# Patient Record
Sex: Male | Born: 2000 | Hispanic: Yes | Marital: Single | Smoking: Never smoker
Health system: Southern US, Community
[De-identification: ages and names within clinical notes are randomized; demographics above are authoritative.]

## PROBLEM LIST (undated history)

## (undated) DIAGNOSIS — F909 Attention-deficit hyperactivity disorder, unspecified type: Secondary | ICD-10-CM

## (undated) DIAGNOSIS — F419 Anxiety disorder, unspecified: Secondary | ICD-10-CM

## (undated) DIAGNOSIS — F32A Depression, unspecified: Secondary | ICD-10-CM

## (undated) HISTORY — DX: Depression, unspecified: F32.A

---
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
## 2020-01-08 ENCOUNTER — Ambulatory Visit: Payer: BC Managed Care – PPO | Admitting: Internal Medicine

## 2020-01-08 ENCOUNTER — Other Ambulatory Visit: Payer: Self-pay

## 2020-01-08 ENCOUNTER — Encounter: Payer: Self-pay | Admitting: Internal Medicine

## 2020-01-08 VITALS — BP 114/82 | HR 85 | Temp 97.5°F | Resp 16 | Ht 74.0 in | Wt 195.0 lb

## 2020-01-08 DIAGNOSIS — Z889 Allergy status to unspecified drugs, medicaments and biological substances status: Secondary | ICD-10-CM

## 2020-01-08 DIAGNOSIS — F411 Generalized anxiety disorder: Secondary | ICD-10-CM

## 2020-01-08 DIAGNOSIS — Z9189 Other specified personal risk factors, not elsewhere classified: Secondary | ICD-10-CM

## 2020-01-08 DIAGNOSIS — Z0001 Encounter for general adult medical examination with abnormal findings: Secondary | ICD-10-CM

## 2020-01-08 NOTE — Progress Notes (Signed)
[facility] [address] [place], [place] [address]  Internal MEDICINE  Office Visit Note  Patient Name: Sean Valdez  [id number]  [id number]  Date of Service: 01/11/2020   Complaints/HPI Pt is here for establishment of PCP. Chief Complaint  Patient presents with  . New Patient (Initial Visit)  . Anxiety  . ADHD  . policy update form    received   HPI Pt is here for establishment. Has hx of ADD, depression with GAD. He was taking Lexapro 30 mg at one point however has been not taking it daily. He does not take Adderall any more either, goes to the gym and found other ways to overcome these problems. Has friends and family is not involved as much, denies use of any street drugs. Has h/o allergies   Current Medication: Outpatient Encounter Medications as of 01/08/2020  Medication Sig  . EPINEPHrine 0.3 mg/0.3 mL IJ SOAJ injection Inject 0.3 mg into the muscle as needed for allergies.  . [DISCONTINUED] methylphenidate 54 MG PO CR tablet Take 1 tablet by mouth daily. (Patient not taking: Reported on 01/08/2020)  . [DISCONTINUED] sertraline (ZOLOFT) 50 MG tablet Take 1.5 tablets by mouth daily. (Patient not taking: Reported on 01/08/2020)  . [DISCONTINUED] traZODone (DESYREL) 50 MG tablet Take 1 tablet by mouth at bedtime. (Patient not taking: Reported on 01/08/2020)   No facility-administered encounter medications on file as of 01/08/2020.    Surgical History: History reviewed. No pertinent surgical history.  Medical History: Past Medical History:  Diagnosis Date  . ADHD (attention deficit hyperactivity disorder)   . Anxiety   . Depression     Family History: Family History  Problem Relation Age of Onset  . Hypertension Mother   . ADD / ADHD Sister   . ADD / ADHD Sister     Social History   Socioeconomic History  . Marital status: Single    Spouse name: Not on file  . Number of children: Not on file  . Years of education: Not on file  . Highest  education level: Not on file  Occupational History  . Not on file  Tobacco Use  . Smoking status: Never Smoker  . Smokeless tobacco: Never Used  Vaping Use  . Vaping Use: Some days  Substance and Sexual Activity  . Alcohol use: Not Currently  . Drug use: No  . Sexual activity: Not on file  Other Topics Concern  . Not on file  Social History Narrative  . Not on file   Social Determinants of Health   Financial Resource Strain: Not on file  Food Insecurity: Not on file  Transportation Needs: Not on file  Physical Activity: Not on file  Stress: Not on file  Social Connections: Not on file  Intimate Partner Violence: Not on file     Review of Systems  Constitutional: Negative for chills, fatigue and unexpected weight change.  HENT: Positive for postnasal drip. Negative for congestion, rhinorrhea, sneezing and sore throat.   Eyes: Negative for redness.  Respiratory: Negative for cough, chest tightness and shortness of breath.   Cardiovascular: Negative for chest pain and palpitations.  Gastrointestinal: Negative for abdominal pain, constipation, diarrhea, nausea and vomiting.  Genitourinary: Negative for dysuria and frequency.  Musculoskeletal: Negative for arthralgias, back pain, joint swelling and neck pain.  Skin: Negative for rash.  Neurological: Negative.  Negative for tremors and numbness.  Hematological: Negative for adenopathy. Does not bruise/bleed easily.  Psychiatric/Behavioral: Negative for behavioral problems (Depression), sleep disturbance  and suicidal ideas. The patient is not nervous/anxious.     Vital Signs: BP 114/82   Pulse 85   Temp (!) 97.5 F (36.4 C)   Resp 16   Ht 6\' 2"  (1.88 m)   Wt 195 lb (88.5 kg)   SpO2 98%   BMI 25.04 kg/m    Physical Exam Constitutional:      General: He is not in acute distress.    Appearance: He is well-developed and well-nourished. He is not diaphoretic.  HENT:     Head: Normocephalic and atraumatic.      Mouth/Throat:     Mouth: Oropharynx is clear and moist.     Pharynx: No oropharyngeal exudate.  Eyes:     Extraocular Movements: EOM normal.     Pupils: Pupils are equal, round, and reactive to light.  Neck:     Thyroid: No thyromegaly.     Vascular: No JVD.     Trachea: No tracheal deviation.  Cardiovascular:     Rate and Rhythm: Normal rate and regular rhythm.     Heart sounds: Normal heart sounds. No murmur heard. No friction rub. No gallop.   Pulmonary:     Effort: Pulmonary effort is normal. No respiratory distress.     Breath sounds: No wheezing or rales.  Chest:     Chest wall: No tenderness.  Abdominal:     General: Bowel sounds are normal.     Palpations: Abdomen is soft.  Musculoskeletal:        General: Normal range of motion.     Cervical back: Normal range of motion and neck supple.  Lymphadenopathy:     Cervical: No cervical adenopathy.  Skin:    General: Skin is warm and dry.  Neurological:     Mental Status: He is alert and oriented to person, place, and time.     Cranial Nerves: No cranial nerve deficit.  Psychiatric:        Mood and Affect: Mood and affect normal.        Behavior: Behavior normal.        Thought Content: Thought content normal.        Judgment: Judgment normal.       Assessment/Plan: 1. Encounter for general adult medical examination with abnormal findings Age appropriate labs are ordered  - CBC with Differential/Platelet - Lipid Panel With LDL/HDL Ratio - TSH - T4, free - Comprehensive metabolic panel - Urinalysis - Prolactin; Future  2. H/O multiple allergies Monitor, continue Claritin OTC, Epipen prn   3. GAD (generalized anxiety disorder) Does not require any therapy, CBT, Headspace type of APP reccommended   General Counseling: Wesly verbalizes understanding of the findings of todays visit and agrees with plan of treatment. I have discussed any further diagnostic evaluation that may be needed or ordered today. We also  reviewed his medications today. he has been encouraged to call the office with any questions or concerns that should arise related to todays visit.   Orders Placed This Encounter  Procedures  . CBC with Differential/Platelet  . Lipid Panel With LDL/HDL Ratio  . TSH  . T4, free  . Comprehensive metabolic panel  . Urinalysis  . Prolactin    No orders of the defined types were placed in this encounter.   Time spent:30 Minutes

## 2020-02-16 ENCOUNTER — Telehealth: Payer: Self-pay

## 2020-02-16 NOTE — Telephone Encounter (Signed)
Spoke with pt this morning, they said they went to [facility] and they did not have an order for blood work. Spoke to [facility] and the order is there and they said it has been done. LMOM to inform pt that everything seems to have been resolved but if there are any concerns to call back.

## 2020-02-17 LAB — T4, FREE: Free T4: 1.5 ng/dL (ref 0.82–1.77)

## 2020-02-17 LAB — CBC WITH DIFFERENTIAL/PLATELET
Basophils Absolute: 0 10*3/uL (ref 0.0–0.2)
Basos: 0 %
EOS (ABSOLUTE): 0.1 10*3/uL (ref 0.0–0.4)
Eos: 1 %
Hematocrit: 48 % (ref 37.5–51.0)
Hemoglobin: 16.7 g/dL (ref 13.0–17.7)
Immature Grans (Abs): 0 10*3/uL (ref 0.0–0.1)
Immature Granulocytes: 0 %
Lymphocytes Absolute: 1.5 10*3/uL (ref 0.7–3.1)
Lymphs: 21 %
MCH: 31.4 pg (ref 26.6–33.0)
MCHC: 34.8 g/dL (ref 31.5–35.7)
MCV: 90 fL (ref 79–97)
Monocytes Absolute: 0.6 10*3/uL (ref 0.1–0.9)
Monocytes: 9 %
Neutrophils Absolute: 4.7 10*3/uL (ref 1.4–7.0)
Neutrophils: 69 %
Platelets: 231 10*3/uL (ref 150–450)
RBC: 5.32 x10E6/uL (ref 4.14–5.80)
RDW: 12.4 % (ref 11.6–15.4)
WBC: 6.9 10*3/uL (ref 3.4–10.8)

## 2020-02-17 LAB — COMPREHENSIVE METABOLIC PANEL
ALT: 22 IU/L (ref 0–44)
AST: 22 IU/L (ref 0–40)
Albumin/Globulin Ratio: 1.8 (ref 1.2–2.2)
Albumin: 4.6 g/dL (ref 4.1–5.2)
Alkaline Phosphatase: 86 IU/L (ref 51–125)
BUN/Creatinine Ratio: 10 (ref 9–20)
BUN: 9 mg/dL (ref 6–20)
Bilirubin Total: 1 mg/dL (ref 0.0–1.2)
CO2: 23 mmol/L (ref 20–29)
Calcium: 10.1 mg/dL (ref 8.7–10.2)
Chloride: 102 mmol/L (ref 96–106)
Creatinine, Ser: 0.91 mg/dL (ref 0.76–1.27)
GFR calc Af Amer: 140 mL/min/{1.73_m2} (ref >59)
GFR calc non Af Amer: 121 mL/min/{1.73_m2} (ref >59)
Globulin, Total: 2.6 g/dL (ref 1.5–4.5)
Glucose: 78 mg/dL (ref 65–99)
Potassium: 4.2 mmol/L (ref 3.5–5.2)
Sodium: 140 mmol/L (ref 134–144)
Total Protein: 7.2 g/dL (ref 6.0–8.5)

## 2020-02-17 LAB — LIPID PANEL WITH LDL/HDL RATIO
Cholesterol, Total: 150 mg/dL (ref 100–199)
HDL: 58 mg/dL (ref >39)
LDL Chol Calc (NIH): 70 mg/dL (ref 0–99)
LDL/HDL Ratio: 1.2 ratio (ref 0.0–3.6)
Triglycerides: 125 mg/dL (ref 0–149)
VLDL Cholesterol Cal: 22 mg/dL (ref 5–40)

## 2020-02-17 LAB — TSH: TSH: 1.39 u[IU]/mL (ref 0.450–4.500)

## 2020-02-17 LAB — URINALYSIS
Bilirubin, UA: NEGATIVE
Glucose, UA: NEGATIVE
Ketones, UA: NEGATIVE
Leukocytes,UA: NEGATIVE
Nitrite, UA: NEGATIVE
Specific Gravity, UA: 1.026 (ref 1.005–1.030)
Urobilinogen, Ur: 0.2 mg/dL (ref 0.2–1.0)
pH, UA: 6.5 (ref 5.0–7.5)

## 2020-06-15 ENCOUNTER — Encounter: Payer: Self-pay | Admitting: Emergency Medicine

## 2020-06-15 ENCOUNTER — Emergency Department
Admission: EM | Admit: 2020-06-15 | Discharge: 2020-06-15 | Disposition: A | Discharge status: Home / Self Care | Payer: Medicaid Other | Attending: Emergency Medicine | Admitting: Emergency Medicine

## 2020-06-15 ENCOUNTER — Other Ambulatory Visit: Payer: Self-pay

## 2020-06-15 DIAGNOSIS — Z20822 Contact with and (suspected) exposure to covid-19: Secondary | ICD-10-CM | POA: Diagnosis not present

## 2020-06-15 DIAGNOSIS — F909 Attention-deficit hyperactivity disorder, unspecified type: Secondary | ICD-10-CM | POA: Diagnosis not present

## 2020-06-15 DIAGNOSIS — Z046 Encounter for general psychiatric examination, requested by authority: Secondary | ICD-10-CM | POA: Diagnosis present

## 2020-06-15 DIAGNOSIS — R456 Violent behavior: Secondary | ICD-10-CM | POA: Insufficient documentation

## 2020-06-15 DIAGNOSIS — F32A Depression, unspecified: Secondary | ICD-10-CM

## 2020-06-15 DIAGNOSIS — F332 Major depressive disorder, recurrent severe without psychotic features: Secondary | ICD-10-CM | POA: Diagnosis not present

## 2020-06-15 DIAGNOSIS — R4689 Other symptoms and signs involving appearance and behavior: Secondary | ICD-10-CM

## 2020-06-15 LAB — COMPREHENSIVE METABOLIC PANEL
ALT: 37 U/L (ref 0–44)
AST: 28 U/L (ref 15–41)
Albumin: 4.8 g/dL (ref 3.5–5.0)
Alkaline Phosphatase: 76 U/L (ref 38–126)
Anion gap: 12 (ref 5–15)
BUN: 11 mg/dL (ref 6–20)
CO2: 22 mmol/L (ref 22–32)
Calcium: 9.2 mg/dL (ref 8.9–10.3)
Chloride: 103 mmol/L (ref 98–111)
Creatinine, Ser: 0.77 mg/dL (ref 0.61–1.24)
GFR, Estimated: >60 mL/min (ref >60)
Glucose, Bld: 104 mg/dL — ABNORMAL HIGH (ref 70–99)
Potassium: 3.4 mmol/L — ABNORMAL LOW (ref 3.5–5.1)
Sodium: 137 mmol/L (ref 135–145)
Total Bilirubin: 0.8 mg/dL (ref 0.3–1.2)
Total Protein: 8.1 g/dL (ref 6.5–8.1)

## 2020-06-15 LAB — CBC
HCT: 43.6 % (ref 39.0–52.0)
Hemoglobin: 15.4 g/dL (ref 13.0–17.0)
MCH: 31.6 pg (ref 26.0–34.0)
MCHC: 35.3 g/dL (ref 30.0–36.0)
MCV: 89.3 fL (ref 80.0–100.0)
Platelets: 250 10*3/uL (ref 150–400)
RBC: 4.88 MIL/uL (ref 4.22–5.81)
RDW: 11.9 % (ref 11.5–15.5)
WBC: 7.9 10*3/uL (ref 4.0–10.5)
nRBC: 0 % (ref 0.0–0.2)

## 2020-06-15 LAB — RESP PANEL BY RT-PCR (FLU A&B, COVID) ARPGX2
Influenza A by PCR: NEGATIVE
Influenza B by PCR: NEGATIVE
SARS Coronavirus 2 by RT PCR: NEGATIVE

## 2020-06-15 LAB — URINE DRUG SCREEN, QUALITATIVE (ARMC ONLY)
Amphetamines, Ur Screen: NOT DETECTED
Barbiturates, Ur Screen: NOT DETECTED
Benzodiazepine, Ur Scrn: NOT DETECTED
Cannabinoid 50 Ng, Ur ~~LOC~~: NOT DETECTED
Cocaine Metabolite,Ur ~~LOC~~: NOT DETECTED
MDMA (Ecstasy)Ur Screen: NOT DETECTED
Methadone Scn, Ur: NOT DETECTED
Opiate, Ur Screen: NOT DETECTED
Phencyclidine (PCP) Ur S: NOT DETECTED
Tricyclic, Ur Screen: NOT DETECTED

## 2020-06-15 LAB — ETHANOL: Alcohol, Ethyl (B): 95 mg/dL — ABNORMAL HIGH (ref <10)

## 2020-06-15 LAB — ACETAMINOPHEN LEVEL: Acetaminophen (Tylenol), Serum: <10 ug/mL — ABNORMAL LOW (ref 10–30)

## 2020-06-15 LAB — SALICYLATE LEVEL: Salicylate Lvl: <7 mg/dL — ABNORMAL LOW (ref 7.0–30.0)

## 2020-06-15 NOTE — ED Notes (Signed)
ED psych provider at bedside.
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
## 2020-06-15 NOTE — ED Notes (Signed)
Pt discharged home with mother.  VS stable. Pt denies SI.  All belongings returned to patient. Discharge instructions reviewed with patient.

## 2020-06-15 NOTE — ED Notes (Signed)
Pt's Mother [phone] Pt's father # [phone]

## 2020-06-15 NOTE — Discharge Instructions (Addendum)
Please continue your medications as prescribed.  Please follow-up with [facility].
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
## 2020-07-08 ENCOUNTER — Ambulatory Visit: Payer: Medicaid Other | Admitting: Nurse Practitioner

## 2020-07-09 ENCOUNTER — Telehealth: Payer: Self-pay

## 2020-07-09 NOTE — Telephone Encounter (Signed)
Lvm to r/s 07-08-20 no show visit-Toni

## 2020-07-10 ENCOUNTER — Ambulatory Visit: Payer: Medicaid Other | Admitting: Nurse Practitioner

## 2020-11-05 ENCOUNTER — Encounter: Payer: Self-pay | Admitting: Emergency Medicine

## 2020-11-05 ENCOUNTER — Other Ambulatory Visit: Payer: Self-pay

## 2020-11-05 ENCOUNTER — Emergency Department
Admission: EM | Admit: 2020-11-05 | Discharge: 2020-11-06 | Disposition: A | Discharge status: Home / Self Care | Payer: BC Managed Care – PPO | Attending: Emergency Medicine | Admitting: Emergency Medicine

## 2020-11-05 DIAGNOSIS — Z20822 Contact with and (suspected) exposure to covid-19: Secondary | ICD-10-CM | POA: Insufficient documentation

## 2020-11-05 DIAGNOSIS — Y907 Blood alcohol level of 200-239 mg/100 ml: Secondary | ICD-10-CM | POA: Insufficient documentation

## 2020-11-05 DIAGNOSIS — F10920 Alcohol use, unspecified with intoxication, uncomplicated: Secondary | ICD-10-CM

## 2020-11-05 DIAGNOSIS — Z046 Encounter for general psychiatric examination, requested by authority: Secondary | ICD-10-CM | POA: Diagnosis not present

## 2020-11-05 DIAGNOSIS — R45851 Suicidal ideations: Secondary | ICD-10-CM | POA: Diagnosis not present

## 2020-11-05 DIAGNOSIS — F332 Major depressive disorder, recurrent severe without psychotic features: Secondary | ICD-10-CM | POA: Diagnosis not present

## 2020-11-05 DIAGNOSIS — F10129 Alcohol abuse with intoxication, unspecified: Secondary | ICD-10-CM | POA: Insufficient documentation

## 2020-11-05 DIAGNOSIS — F10929 Alcohol use, unspecified with intoxication, unspecified: Secondary | ICD-10-CM | POA: Diagnosis present

## 2020-11-05 LAB — COMPREHENSIVE METABOLIC PANEL
ALT: 136 U/L — ABNORMAL HIGH (ref 0–44)
AST: 108 U/L — ABNORMAL HIGH (ref 15–41)
Albumin: 4.5 g/dL (ref 3.5–5.0)
Alkaline Phosphatase: 68 U/L (ref 38–126)
Anion gap: 11 (ref 5–15)
BUN: 8 mg/dL (ref 6–20)
CO2: 26 mmol/L (ref 22–32)
Calcium: 9.2 mg/dL (ref 8.9–10.3)
Chloride: 101 mmol/L (ref 98–111)
Creatinine, Ser: 0.82 mg/dL (ref 0.61–1.24)
GFR, Estimated: >60 mL/min (ref >60)
Glucose, Bld: 118 mg/dL — ABNORMAL HIGH (ref 70–99)
Potassium: 3 mmol/L — ABNORMAL LOW (ref 3.5–5.1)
Sodium: 138 mmol/L (ref 135–145)
Total Bilirubin: 1.2 mg/dL (ref 0.3–1.2)
Total Protein: 7.7 g/dL (ref 6.5–8.1)

## 2020-11-05 LAB — CBC
HCT: 48.4 % (ref 39.0–52.0)
Hemoglobin: 17.3 g/dL — ABNORMAL HIGH (ref 13.0–17.0)
MCH: 31.9 pg (ref 26.0–34.0)
MCHC: 35.7 g/dL (ref 30.0–36.0)
MCV: 89.1 fL (ref 80.0–100.0)
Platelets: 260 10*3/uL (ref 150–400)
RBC: 5.43 MIL/uL (ref 4.22–5.81)
RDW: 11.9 % (ref 11.5–15.5)
WBC: 9.9 10*3/uL (ref 4.0–10.5)
nRBC: 0 % (ref 0.0–0.2)

## 2020-11-05 LAB — URINE DRUG SCREEN, QUALITATIVE (ARMC ONLY)
Amphetamines, Ur Screen: NOT DETECTED
Barbiturates, Ur Screen: NOT DETECTED
Benzodiazepine, Ur Scrn: NOT DETECTED
Cannabinoid 50 Ng, Ur ~~LOC~~: NOT DETECTED
Cocaine Metabolite,Ur ~~LOC~~: NOT DETECTED
MDMA (Ecstasy)Ur Screen: NOT DETECTED
Methadone Scn, Ur: NOT DETECTED
Opiate, Ur Screen: NOT DETECTED
Phencyclidine (PCP) Ur S: NOT DETECTED
Tricyclic, Ur Screen: NOT DETECTED

## 2020-11-05 LAB — ETHANOL: Alcohol, Ethyl (B): 231 mg/dL — ABNORMAL HIGH (ref <10)
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
## 2020-11-05 NOTE — ED Notes (Addendum)
Pt belongings:  Black t-shirt, black sweatshirt, brown sandals, black socks, black shorts, green and white boxers
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
## 2020-11-05 NOTE — ED Provider Notes (Signed)
[facility] Emergency Department Provider Note  ____________________________________________   Event Date/Time   First MD Initiated Contact with Patient 11/05/20 2311     (approximate)  I have reviewed the triage vital signs and the nursing notes.   HISTORY  Chief Complaint Psychiatric Evaluation    HPI Sean Valdez is a 20 y.o. male with history of depression, ADHD, anxiety with previous suicide attempts who presents to the emergency department under IVC.  Patient is intoxicated and reports that he felt "sad" today.  When asked if he is feeling suicidal patient is very vague and states he was feeling suicidal earlier but is not able to tell me if he is suicidal now.  Denies HI, hallucinations.  States his parents were concerned about him tonight and he was brought in by police.    Patient brought to the emergency department by her grandma deputy.  Deputy took out [employer].  Per IVC paperwork "respondent stated to deputies that he wanted to kill himself.  He is upset about his relationship with his girlfriend and is not taking his prescribed medication for depression.  Respondent is also self-medicating with alcohol."    Past Medical History:  Diagnosis Date   ADHD (attention deficit hyperactivity disorder)    Anxiety    Depression     Patient Active Problem List   Diagnosis Date Noted   MDD (major depressive disorder), recurrent episode, severe (HCC) 06/15/2020    History reviewed. No pertinent surgical history.  Prior to Admission medications   Medication Sig Start Date End Date Taking? Authorizing Provider  EPINEPHrine 0.3 mg/0.3 mL IJ SOAJ injection Inject 0.3 mg into the muscle as needed for allergies. 08/30/15   [provider]    Allergies Penicillins and Bupropion  Family History  Problem Relation Age of Onset   Hypertension Mother    ADD / ADHD Sister    ADD / ADHD Sister     Social History Social History   Tobacco  Use   Smoking status: Never   Smokeless tobacco: Never  Vaping Use   Vaping Use: Some days  Substance Use Topics   Alcohol use: Not Currently   Drug use: No    Review of Systems Constitutional: No fever. Eyes: No visual changes. ENT: No sore throat. Cardiovascular: Denies chest pain. Respiratory: Denies shortness of breath. Gastrointestinal: No nausea, vomiting, diarrhea. Genitourinary: Negative for dysuria. Musculoskeletal: Negative for back pain. Skin: Negative for rash. Neurological: Negative for focal weakness or numbness.  ____________________________________________   PHYSICAL EXAM:  VITAL SIGNS: ED Triage Vitals  Enc Vitals Group     BP 11/05/20 2249 (!) 143/93     Pulse Rate 11/05/20 2249 (!) 125     Resp 11/05/20 2249 16     Temp 11/05/20 2249 98.7 F (37.1 C)     Temp Source 11/05/20 2249 Oral     SpO2 11/05/20 2249 98 %     Weight 11/05/20 2255 204 lb (92.5 kg)     Height 11/05/20 2255 6\' 2"  (1.88 m)     Head Circumference --      Peak Flow --      Pain Score 11/05/20 2254 0     Pain Loc --      Pain Edu? --      Excl. in GC? --    CONSTITUTIONAL: Alert and oriented and responds appropriately to questions.  Peers intoxicated. HEAD: Normocephalic EYES: Conjunctivae clear, pupils appear equal, EOM appear intact ENT:  normal nose; moist mucous membranes NECK: Supple, normal ROM CARD: RRR; S1 and S2 appreciated; no murmurs, no clicks, no rubs, no gallops RESP: Normal chest excursion without splinting or tachypnea; breath sounds clear and equal bilaterally; no wheezes, no rhonchi, no rales, no hypoxia or respiratory distress, speaking full sentences ABD/GI: Normal bowel sounds; non-distended; soft, non-tender, no rebound, no guarding, no peritoneal signs, no hepatosplenomegaly BACK: The back appears normal EXT: Normal ROM in all joints; no deformity noted, no edema; no cyanosis SKIN: Normal color for age and race; warm; no rash on exposed skin NEURO:  Moves all extremities equally PSYCH: Patient is very vague and reluctant to answer questions about suicidal thoughts.  Denies HI or hallucinations.  ____________________________________________   LABS (all labs ordered are listed, but only abnormal results are displayed)  Labs Reviewed  COMPREHENSIVE METABOLIC PANEL - Abnormal; Notable for the following components:      Result Value   Potassium 3.0 (*)    Glucose, Bld 118 (*)    AST 108 (*)    ALT 136 (*)    All other components within normal limits  ETHANOL - Abnormal; Notable for the following components:   Alcohol, Ethyl (B) 231 (*)    All other components within normal limits  SALICYLATE LEVEL - Abnormal; Notable for the following components:   Salicylate Lvl <7.0 (*)    All other components within normal limits  ACETAMINOPHEN LEVEL - Abnormal; Notable for the following components:   Acetaminophen (Tylenol), Serum <10 (*)    All other components within normal limits  CBC - Abnormal; Notable for the following components:   Hemoglobin 17.3 (*)    All other components within normal limits  RESP PANEL BY RT-PCR (FLU A&B, COVID) ARPGX2  URINE DRUG SCREEN, QUALITATIVE ([facility] ONLY)  MAGNESIUM   ____________________________________________  EKG   EKG Interpretation  Date/Time:  Wednesday November 06 2020 00:12:37 EDT Ventricular Rate:  95 PR Interval:  142 QRS Duration: 96 QT Interval:  350 QTC Calculation: 439 R Axis:   97 Text Interpretation: Normal sinus rhythm Rightward axis Borderline ECG Confirmed by Rochele Raring [phone]) on 11/06/2020 12:15:52 AM        ____________________________________________  RADIOLOGY Normajean Baxter Malcolm Quast, personally viewed and evaluated these images (plain radiographs) as part of my medical decision making, as well as reviewing the written report by the radiologist.  ED MD interpretation:    Official radiology report(s): No results  found.  ____________________________________________   PROCEDURES  Procedure(s) performed (including Critical Care):  Procedures    ____________________________________________   INITIAL IMPRESSION / ASSESSMENT AND PLAN / ED COURSE  As part of my medical decision making, I reviewed the following data within the electronic MEDICAL RECORD NUMBER Nursing notes reviewed and incorporated, Labs reviewed , EKG interpreted , Old EKG reviewed, Old chart reviewed, A consult was requested and obtained from this/these consultant(s) Psychiatry, Notes from prior ED visits, and [place] Controlled Substance Database         Patient here under IVC by Cheree Ditto deputy for expressing suicidal thoughts.  Patient very vague at this time about suicidal thoughts but states he has had a previous history of suicide attempts.  We will obtain screening labs urine and consult TTS, psychiatry for further evaluation and disposition.  He is under full IVC at this time.  ED PROGRESS  1:38 AM  Patient seen by Gillermo Murdoch, NP with psychiatry and TTS.  They recommend inpatient psychiatric admission.  Patient's labs show alcohol level of 231.  Potassium low at 3.0.  Museum level normal.  EKG shows no interval abnormalities.  Heart rate has improved.  Given oral replacement for his hypokalemia.  Medically cleared at this time.   I reviewed all nursing notes and pertinent previous records as available.  I have reviewed and interpreted any EKGs, lab and urine results, imaging (as available).  ____________________________________________   FINAL CLINICAL IMPRESSION(S) / ED DIAGNOSES  Final diagnoses:  Suicidal ideation  Alcoholic intoxication without complication [facility])     ED Discharge Orders     None       *Please note:  Sean Valdez was evaluated in Emergency Department on 11/06/2020 for the symptoms described in the history of present illness. He was evaluated in the context of the global COVID-19  pandemic, which necessitated consideration that the patient might be at risk for infection with the SARS-CoV-2 virus that causes COVID-19. Institutional protocols and algorithms that pertain to the evaluation of patients at risk for COVID-19 are in a state of rapid change based on information released by regulatory bodies including the CDC and federal and state organizations. These policies and algorithms were followed during the patient's care in the ED.  Some ED evaluations and interventions may be delayed as a result of limited staffing during and the pandemic.*   Note:  This document was prepared using Dragon voice recognition software and may include unintentional dictation errors.    Norvin Ohlin, Layla Maw, DO 11/06/20 0140

## 2020-11-06 DIAGNOSIS — F10920 Alcohol use, unspecified with intoxication, uncomplicated: Secondary | ICD-10-CM | POA: Diagnosis not present

## 2020-11-06 DIAGNOSIS — F10129 Alcohol abuse with intoxication, unspecified: Secondary | ICD-10-CM | POA: Diagnosis not present

## 2020-11-06 DIAGNOSIS — F10929 Alcohol use, unspecified with intoxication, unspecified: Secondary | ICD-10-CM | POA: Diagnosis present

## 2020-11-06 LAB — RESP PANEL BY RT-PCR (FLU A&B, COVID) ARPGX2
Influenza A by PCR: NEGATIVE
Influenza B by PCR: NEGATIVE
SARS Coronavirus 2 by RT PCR: NEGATIVE

## 2020-11-06 LAB — ACETAMINOPHEN LEVEL: Acetaminophen (Tylenol), Serum: <10 ug/mL — ABNORMAL LOW (ref 10–30)

## 2020-11-06 LAB — SALICYLATE LEVEL: Salicylate Lvl: <7 mg/dL — ABNORMAL LOW (ref 7.0–30.0)

## 2020-11-06 LAB — MAGNESIUM: Magnesium: 2.4 mg/dL (ref 1.7–2.4)

## 2020-11-06 MED ORDER — POTASSIUM CHLORIDE CRYS ER 20 MEQ PO TBCR
40.0000 meq | EXTENDED_RELEASE_TABLET | Freq: Once | ORAL | Status: AC
  Administered 2020-11-06: 40 meq via ORAL
  Filled 2020-11-06: qty 2

## 2020-11-06 NOTE — BH Assessment (Signed)
Psych Team met with patient for reassessment. Patient reports he drank "too much" last night and doesn't remember how he arrived at the ED. Patient reports he sees a therapist and psychiatrist regularly. Patient states he is working and has plans for the future. Patient denies current SI/HI/AH/VH. Patient reports he will follow up with providers and get back on track with his medications. Patient was calm and cooperative.   Per Louise, NP, patient does not meet criteria for inpatient psychiatric admission.  

## 2020-11-06 NOTE — Consult Note (Signed)
[facility] Face-to-Face Psychiatry Consult   Reason for Consult:Psychiatric Evaluation Referring Physician: Dr. Elesa Massed  Patient Identification: Sean Valdez MRN:  [id number] Principal Diagnosis: <principal problem not specified> Diagnosis:  Active Problems:   Alcohol intoxication (HCC)   Total Time spent with patient: 45 minutes  Subjective: " I drink whenever I feel like drinking." Sean Valdez is a 20 y.o. male patient presented to [facility] ED via law enforcement and his grandmother under Involuntary commitment status (IVC). Per IVC paperwork, "respondent stated to deputies that he wanted to kill himself.  He is upset about his relationship with his girlfriend and is not taking his prescribed medication for depression.  Respondent is also self-medicating with alcohol." The patient has had previous suicide attempts and has been consuming more alcohol than usual. The patient's BAL on arrival at the ED was 231 mg/dl. The patient stated, "I drink when I feel like it." The patient said he and his girlfriend broke up about three months ago. During his assessment, the patient was somewhat intoxicated but could answer some questions.   The patient was seen face-to-face by this provider; the chart was reviewed and consulted with Dr.Ward on 11/06/2020 due to the patient's care. It was discussed with the EDP that the patient does meet the criteria to be admitted to the psychiatric inpatient unit.  On evaluation, the patient is alert and oriented x 2, calm, cooperative, and mood-congruent with affect. The patient does not appear to be responding to internal or external stimuli. The patient denies auditory or visual hallucinations. The patient denies any suicidal, homicidal, or self-harm ideations. The patient is not presenting with any psychotic or paranoid behaviors.   HPI: Per Dr. Elesa Massed, Sean Valdez is a 20 y.o. male with history of depression, ADHD, anxiety with previous suicide attempts who presents to the emergency  department under IVC.  Patient is intoxicated and reports that he felt "sad" today.  When asked if he is feeling suicidal patient is very vague and states he was feeling suicidal earlier but is not able to tell me if he is suicidal now.  Denies HI, hallucinations.  States his parents were concerned about him tonight and he was brought in by police.    Patient brought to the emergency department by her grandma deputy.  Deputy took out [employer].  Per IVC paperwork "respondent stated to deputies that he wanted to kill himself.  He is upset about his relationship with his girlfriend and is not taking his prescribed medication for depression.  Respondent is also self-medicating with alcohol."  Past Psychiatric History:   ADHD (attention deficit hyperactivity disorder)    Anxiety   Depression     Risk to Self:   Risk to Others:   Prior Inpatient Therapy:   Prior Outpatient Therapy:    Past Medical History:  Past Medical History:  Diagnosis Date   ADHD (attention deficit hyperactivity disorder)    Anxiety    Depression    History reviewed. No pertinent surgical history. Family History:  Family History  Problem Relation Age of Onset   Hypertension Mother    ADD / ADHD Sister    ADD / ADHD Sister    Family Psychiatric  History:  Social History:  Social History   Substance and Sexual Activity  Alcohol Use Not Currently     Social History   Substance and Sexual Activity  Drug Use No    Social History   Socioeconomic History   Marital status: Single  Spouse name: Not on file   Number of children: Not on file   Years of education: Not on file   Highest education level: Not on file  Occupational History   Not on file  Tobacco Use   Smoking status: Never   Smokeless tobacco: Never  Vaping Use   Vaping Use: Some days  Substance and Sexual Activity   Alcohol use: Not Currently   Drug use: No   Sexual activity: Not on file  Other Topics Concern   Not on file  Social  History Narrative   Not on file   Social Determinants of Health   Financial Resource Strain: Not on file  Food Insecurity: Not on file  Transportation Needs: Not on file  Physical Activity: Not on file  Stress: Not on file  Social Connections: Not on file   Additional Social History:    Allergies:   Allergies  Allergen Reactions   Penicillins Hives    Has patient had a PCN reaction causing immediate rash, facial/tongue/throat swelling, SOB or lightheadedness with hypotension:yes Has patient had a PCN reaction causing severe rash involving mucus membranes or skin necrosis: no Has patient had a PCN reaction that required hospitalizationno Has patient had a PCN reaction occurring within the last 10 years: yes If all of the above answers are "NO", then may proceed with Cephalosporin use.    Bupropion Rash    Labs:  Results for orders placed or performed during the hospital encounter of 11/05/20 (from the past 48 hour(s))  Comprehensive metabolic panel     Status: Abnormal   Collection Time: 11/05/20 11:02 PM  Result Value Ref Range   Sodium 138 135 - 145 mmol/L   Potassium 3.0 (L) 3.5 - 5.1 mmol/L   Chloride 101 98 - 111 mmol/L   CO2 26 22 - 32 mmol/L   Glucose, Bld 118 (H) 70 - 99 mg/dL    Comment: Glucose reference range applies only to samples taken after fasting for at least 8 hours.   BUN 8 6 - 20 mg/dL   Creatinine, Ser 8.46 0.61 - 1.24 mg/dL   Calcium 9.2 8.9 - 96.2 mg/dL   Total Protein 7.7 6.5 - 8.1 g/dL   Albumin 4.5 3.5 - 5.0 g/dL   AST 952 (H) 15 - 41 U/L   ALT 136 (H) 0 - 44 U/L   Alkaline Phosphatase 68 38 - 126 U/L   Total Bilirubin 1.2 0.3 - 1.2 mg/dL   GFR, Estimated >84 >13 mL/min    Comment: (NOTE) Calculated using the CKD-EPI Creatinine Equation (2021)    Anion gap 11 5 - 15    Comment: Performed at [facility], [address] [place], [place] [address]  Ethanol     Status: Abnormal   Collection Time: 11/05/20 11:02 PM  Result Value  Ref Range   Alcohol, Ethyl (B) 231 (H) <10 mg/dL    Comment: (NOTE) Lowest detectable limit for serum alcohol is 10 mg/dL.  For medical purposes only. Performed at [facility], [address] [place], [place] [address]   Salicylate level     Status: Abnormal   Collection Time: 11/05/20 11:02 PM  Result Value Ref Range   Salicylate Lvl <7.0 (L) 7.0 - 30.0 mg/dL    Comment: Performed at [facility], [address] [place], [place] [address]  Acetaminophen level     Status: Abnormal   Collection Time: 11/05/20 11:02 PM  Result Value Ref Range   Acetaminophen (  Tylenol), Serum <10 (L) 10 - 30 ug/mL    Comment: (NOTE) Therapeutic concentrations vary significantly. A range of 10-30 ug/mL  may be an effective concentration for many patients. However, some  are best treated at concentrations outside of this range. Acetaminophen concentrations >150 ug/mL at 4 hours after ingestion  and >50 ug/mL at 12 hours after ingestion are often associated with  toxic reactions.  Performed at [facility], [address] [place], [place] [address]   cbc     Status: Abnormal   Collection Time: 11/05/20 11:02 PM  Result Value Ref Range   WBC 9.9 4.0 - 10.5 K/uL   RBC 5.43 4.22 - 5.81 MIL/uL   Hemoglobin 17.3 (H) 13.0 - 17.0 g/dL   HCT 29.5 62.1 - 30.8 %   MCV 89.1 80.0 - 100.0 fL   MCH 31.9 26.0 - 34.0 pg   MCHC 35.7 30.0 - 36.0 g/dL   RDW 65.7 84.6 - 96.2 %   Platelets 260 150 - 400 K/uL   nRBC 0.0 0.0 - 0.2 %    Comment: Performed at [facility], [address], [place], [place] [address]  Urine Drug Screen, Qualitative     Status: None   Collection Time: 11/05/20 11:02 PM  Result Value Ref Range   Tricyclic, Ur Screen NONE DETECTED NONE DETECTED   Amphetamines, Ur Screen NONE DETECTED NONE DETECTED   MDMA (Ecstasy)Ur Screen NONE DETECTED NONE DETECTED   Cocaine Metabolite,Ur [place] NONE DETECTED NONE DETECTED   Opiate, Ur Screen NONE DETECTED NONE  DETECTED   Phencyclidine (PCP) Ur S NONE DETECTED NONE DETECTED   Cannabinoid 50 Ng, Ur [place] NONE DETECTED NONE DETECTED   Barbiturates, Ur Screen NONE DETECTED NONE DETECTED   Benzodiazepine, Ur Scrn NONE DETECTED NONE DETECTED   Methadone Scn, Ur NONE DETECTED NONE DETECTED    Comment: (NOTE) Tricyclics + metabolites, urine    Cutoff 1000 ng/mL Amphetamines + metabolites, urine  Cutoff 1000 ng/mL MDMA (Ecstasy), urine              Cutoff 500 ng/mL Cocaine Metabolite, urine          Cutoff 300 ng/mL Opiate + metabolites, urine        Cutoff 300 ng/mL Phencyclidine (PCP), urine         Cutoff 25 ng/mL Cannabinoid, urine                 Cutoff 50 ng/mL Barbiturates + metabolites, urine  Cutoff 200 ng/mL Benzodiazepine, urine              Cutoff 200 ng/mL Methadone, urine                   Cutoff 300 ng/mL  The urine drug screen provides only a preliminary, unconfirmed analytical test result and should not be used for non-medical purposes. Clinical consideration and professional judgment should be applied to any positive drug screen result due to possible interfering substances. A more specific alternate chemical method must be used in order to obtain a confirmed analytical result. Gas chromatography / mass spectrometry (GC/MS) is the preferred confirm atory method. Performed at [facility], [address] [place], [place] [address]   Magnesium     Status: None   Collection Time: 11/05/20 11:02 PM  Result Value Ref Range   Magnesium 2.4 1.7 - 2.4 mg/dL    Comment: Performed at [facility], [address] [place], [place] [address]  Resp Panel  by RT-PCR (Flu A&B, Covid) Nasopharyngeal Swab     Status: None   Collection Time: 11/05/20 11:36 PM   Specimen: Nasopharyngeal Swab; Nasopharyngeal(NP) swabs in vial transport medium  Result Value Ref Range   SARS Coronavirus 2 by RT PCR NEGATIVE NEGATIVE    Comment: (NOTE) SARS-CoV-2 target nucleic acids are NOT  DETECTED.  The SARS-CoV-2 RNA is generally detectable in upper respiratory specimens during the acute phase of infection. The lowest concentration of SARS-CoV-2 viral copies this assay can detect is 138 copies/mL. A negative result does not preclude SARS-Cov-2 infection and should not be used as the sole basis for treatment or other patient management decisions. A negative result may occur with  improper specimen collection/handling, submission of specimen other than nasopharyngeal swab, presence of viral mutation(s) within the areas targeted by this assay, and inadequate number of viral copies(<138 copies/mL). A negative result must be combined with clinical observations, patient history, and epidemiological information. The expected result is Negative.  Fact Sheet for Patients:  BloggerCourse.com  Fact Sheet for Healthcare Providers:  SeriousBroker.it  This test is no t yet approved or cleared by the [place] FDA and  has been authorized for detection and/or diagnosis of SARS-CoV-2 by FDA under an Emergency Use Authorization (EUA). This EUA will remain  in effect (meaning this test can be used) for the duration of the COVID-19 declaration under Section 564(b)(1) of the Act, 21 U.S.C.section 360bbb-3(b)(1), unless the authorization is terminated  or revoked sooner.       Influenza A by PCR NEGATIVE NEGATIVE   Influenza B by PCR NEGATIVE NEGATIVE    Comment: (NOTE) The Xpert Xpress SARS-CoV-2/FLU/RSV plus assay is intended as an aid in the diagnosis of influenza from Nasopharyngeal swab specimens and should not be used as a sole basis for treatment. Nasal washings and aspirates are unacceptable for Xpert Xpress SARS-CoV-2/FLU/RSV testing.  Fact Sheet for Patients: BloggerCourse.com  Fact Sheet for Healthcare Providers: SeriousBroker.it  This test is not yet approved or  cleared by the [place] FDA and has been authorized for detection and/or diagnosis of SARS-CoV-2 by FDA under an Emergency Use Authorization (EUA). This EUA will remain in effect (meaning this test can be used) for the duration of the COVID-19 declaration under Section 564(b)(1) of the Act, 21 U.S.C. section 360bbb-3(b)(1), unless the authorization is terminated or revoked.  Performed at [facility], [address] [place], [place] [address]     No current facility-administered medications for this encounter.   Current Outpatient Medications  Medication Sig Dispense Refill   EPINEPHrine 0.3 mg/0.3 mL IJ SOAJ injection Inject 0.3 mg into the muscle as needed for allergies.  1    Musculoskeletal: Strength & Muscle Tone: within normal limits Gait & Station: normal Patient leans: N/A  Psychiatric Specialty Exam:  Presentation  General Appearance: Bizarre  Eye Contact:Poor  Speech:Garbled  Speech Volume:Decreased  Handedness:Right   Mood and Affect  Mood:Depressed  Affect:Depressed; Inappropriate   Thought Process  Thought Processes:Disorganized  Descriptions of Associations:Loose  Orientation:Partial  Thought Content:Illogical; Scattered  History of Schizophrenia/Schizoaffective disorder:No  Duration of Psychotic Symptoms:No data recorded Hallucinations:Hallucinations: None  Ideas of Reference:None  Suicidal Thoughts:Suicidal Thoughts: No  Homicidal Thoughts:Homicidal Thoughts: No   Sensorium  Memory:Immediate Poor; Recent Poor; Remote Poor  Judgment:Poor  Insight:Poor   Executive Functions  Concentration:Poor  Attention Span:Poor  Recall:Poor  Fund of Knowledge:Poor  Language:Poor   Psychomotor Activity  Psychomotor Activity:Psychomotor Activity: Normal   Assets  Assets:Communication Skills; Desire for  Improvement; Intimacy; Resilience; Social Support   Sleep  Sleep:Sleep: Good   Physical Exam: Physical  Exam Vitals and nursing note reviewed.  Constitutional:      Appearance: He is normal weight.  HENT:     Head: Normocephalic and atraumatic.     Nose: Nose normal.     Mouth/Throat:     Mouth: Mucous membranes are moist.  Cardiovascular:     Rate and Rhythm: Tachycardia present.  Pulmonary:     Effort: Pulmonary effort is normal.  Musculoskeletal:        General: Normal range of motion.     Cervical back: Normal range of motion and neck supple.  Neurological:     Mental Status: He is alert. He is disoriented.  Psychiatric:        Attention and Perception: Attention and perception normal.        Mood and Affect: Mood is depressed. Affect is flat and inappropriate.        Speech: Speech is delayed.        Behavior: Behavior is cooperative.        Cognition and Memory: Cognition is impaired.        Judgment: Judgment is impulsive and inappropriate.   Review of Systems  Psychiatric/Behavioral:  Positive for depression and substance abuse.   All other systems reviewed and are negative. Blood pressure (!) 143/93, pulse (!) 125, temperature 98.7 F (37.1 C), temperature source Oral, resp. rate 16, height 6\' 2"  (1.88 m), weight 92.5 kg, SpO2 98 %. Body mass index is 26.19 kg/m.  Treatment Plan Summary: Plan Patient does meet criteria for psychiatric inpatient admission  Disposition: Recommend psychiatric Inpatient admission when medically cleared. Supportive therapy provided about ongoing stressors.  , NP 11/06/2020 2:22 AM

## 2020-11-06 NOTE — ED Notes (Signed)
IVC  PAPERS  RESCINDED  PER  DR  CLAPACS  MD INFORMED  ALLY  RN 

## 2020-11-06 NOTE — Discharge Instructions (Addendum)
Return to the ER for any new, worsening, recurrent thoughts of wanting hurt yourself or anyone else, or any other new or worsening symptoms that concern you.

## 2020-11-06 NOTE — Consult Note (Signed)
[facility] Face-to-Face Psychiatry Consult   Reason for Consult:  Suicidal statements in context of intoxication Referring Physician:  Siadecki Patient Identification: Sean Valdez MRN:  [id number] Principal Diagnosis: Alcohol intoxication (HCC) Diagnosis:  Principal Problem:   Alcohol intoxication (HCC)   Total Time spent with patient: 45 minutes  Subjective:  "I probably drink too much" Sean Valdez is a 20 y.o. male patient admitted with alcohol intoxication  HPI:  Patient was seen face-to-face with Dr. Toni Amend for re-assessment this morning. Chart reviewed. Patient is alert and cooperative. He says he really doesn't remember why he is here. When prompted, he recalls that he "drank a lot" last night and was upset about a breakup with girlfriend. Patient states he drinks at least 4 x weekly, about "3 or 4 beers." He states he probably had 8 beers last night. He denies ever having a seizure after drinking. Patient sees Theresa Mulligan, NP in [place] and states he is on Adderall and "something else." (Effexor per chart review). Unclear if he takes these medications regularly. Patient states he also sees a therapist, but doesn't remember the name. He goes every couple months. Patient denies current suicidal thoughts and states he has never done anything to attempt suicide.  He responds that any suicidal thoughts he expressed  prior to presenting to ED were done under the influence of alcohol and he did not mean it. He denies homicidal thoughts, paranoia, or hallucinations, all of which is congruent with current presentation. Patient expresses future-oriented thinking. He has a job and family support. Is under the care of outpatient psychiatric providers. Denies other illicit drug use. Patient receptive to consideration of decrease in alcohol consumption, as it is likely to adversely affect his mood.  Patient is requesting discharge home. He does not currently meet  criteria for inpatient admission.    Past Psychiatric History: See previous  Risk to Self:   Risk to Others:   Prior Inpatient Therapy:   Prior Outpatient Therapy:    Past Medical History:  Past Medical History:  Diagnosis Date   ADHD (attention deficit hyperactivity disorder)    Anxiety    Depression    History reviewed. No pertinent surgical history. Family History:  Family History  Problem Relation Age of Onset   Hypertension Mother    ADD / ADHD Sister    ADD / ADHD Sister    Family Psychiatric  History: See previous Social History:  Social History   Substance and Sexual Activity  Alcohol Use Not Currently     Social History   Substance and Sexual Activity  Drug Use No    Social History   Socioeconomic History   Marital status: Single    Spouse name: Not on file   Number of children: Not on file   Years of education: Not on file   Highest education level: Not on file  Occupational History   Not on file  Tobacco Use   Smoking status: Never   Smokeless tobacco: Never  Vaping Use   Vaping Use: Some days  Substance and Sexual Activity   Alcohol use: Not Currently   Drug use: No   Sexual activity: Not on file  Other Topics Concern   Not on file  Social History Narrative   Not on file   Social Determinants of Health   Financial Resource Strain: Not on file  Food Insecurity: Not on file  Transportation Needs: Not on file  Physical Activity: Not on file  Stress: Not on  file  Social Connections: Not on file   Additional Social History:    Allergies:   Allergies  Allergen Reactions   Penicillins Hives    Has patient had a PCN reaction causing immediate rash, facial/tongue/throat swelling, SOB or lightheadedness with hypotension:yes Has patient had a PCN reaction causing severe rash involving mucus membranes or skin necrosis: no Has patient had a PCN reaction that required hospitalizationno Has patient had a PCN reaction occurring within the last 10 years: yes If all of the above  answers are "NO", then may proceed with Cephalosporin use.    Bupropion Rash    Labs:  Results for orders placed or performed during the hospital encounter of 11/05/20 (from the past 48 hour(s))  Comprehensive metabolic panel     Status: Abnormal   Collection Time: 11/05/20 11:02 PM  Result Value Ref Range   Sodium 138 135 - 145 mmol/L   Potassium 3.0 (L) 3.5 - 5.1 mmol/L   Chloride 101 98 - 111 mmol/L   CO2 26 22 - 32 mmol/L   Glucose, Bld 118 (H) 70 - 99 mg/dL    Comment: Glucose reference range applies only to samples taken after fasting for at least 8 hours.   BUN 8 6 - 20 mg/dL   Creatinine, Ser 9.83 0.61 - 1.24 mg/dL   Calcium 9.2 8.9 - 38.2 mg/dL   Total Protein 7.7 6.5 - 8.1 g/dL   Albumin 4.5 3.5 - 5.0 g/dL   AST 505 (H) 15 - 41 U/L   ALT 136 (H) 0 - 44 U/L   Alkaline Phosphatase 68 38 - 126 U/L   Total Bilirubin 1.2 0.3 - 1.2 mg/dL   GFR, Estimated >39 >76 mL/min    Comment: (NOTE) Calculated using the CKD-EPI Creatinine Equation (2021)    Anion gap 11 5 - 15    Comment: Performed at [facility], [address] [place], [place] [address]  Ethanol     Status: Abnormal   Collection Time: 11/05/20 11:02 PM  Result Value Ref Range   Alcohol, Ethyl (B) 231 (H) <10 mg/dL    Comment: (NOTE) Lowest detectable limit for serum alcohol is 10 mg/dL.  For medical purposes only. Performed at [facility], [address] [place], [place] [address]   Salicylate level     Status: Abnormal   Collection Time: 11/05/20 11:02 PM  Result Value Ref Range   Salicylate Lvl <7.0 (L) 7.0 - 30.0 mg/dL    Comment: Performed at [facility], [address] [place], [place] [address]  Acetaminophen level     Status: Abnormal   Collection Time: 11/05/20 11:02 PM  Result Value Ref Range   Acetaminophen (Tylenol), Serum <10 (L) 10 - 30 ug/mL    Comment: (NOTE) Therapeutic concentrations vary significantly. A range of 10-30 ug/mL  may be an effective  concentration for many patients. However, some  are best treated at concentrations outside of this range. Acetaminophen concentrations >150 ug/mL at 4 hours after ingestion  and >50 ug/mL at 12 hours after ingestion are often associated with  toxic reactions.  Performed at [facility], [address] [place], [place] [address]   cbc     Status: Abnormal   Collection Time: 11/05/20 11:02 PM  Result Value Ref Range   WBC 9.9 4.0 - 10.5 K/uL   RBC 5.43 4.22 - 5.81 MIL/uL   Hemoglobin 17.3 (H) 13.0 - 17.0 g/dL   HCT 24.2 68.3 - 41.9 %  MCV 89.1 80.0 - 100.0 fL   MCH 31.9 26.0 - 34.0 pg   MCHC 35.7 30.0 - 36.0 g/dL   RDW 29.5 62.1 - 30.8 %   Platelets 260 150 - 400 K/uL   nRBC 0.0 0.0 - 0.2 %    Comment: Performed at [facility], [address] [place], [place] [address]  Urine Drug Screen, Qualitative     Status: None   Collection Time: 11/05/20 11:02 PM  Result Value Ref Range   Tricyclic, Ur Screen NONE DETECTED NONE DETECTED   Amphetamines, Ur Screen NONE DETECTED NONE DETECTED   MDMA (Ecstasy)Ur Screen NONE DETECTED NONE DETECTED   Cocaine Metabolite,Ur [place] NONE DETECTED NONE DETECTED   Opiate, Ur Screen NONE DETECTED NONE DETECTED   Phencyclidine (PCP) Ur S NONE DETECTED NONE DETECTED   Cannabinoid 50 Ng, Ur [place] NONE DETECTED NONE DETECTED   Barbiturates, Ur Screen NONE DETECTED NONE DETECTED   Benzodiazepine, Ur Scrn NONE DETECTED NONE DETECTED   Methadone Scn, Ur NONE DETECTED NONE DETECTED    Comment: (NOTE) Tricyclics + metabolites, urine    Cutoff 1000 ng/mL Amphetamines + metabolites, urine  Cutoff 1000 ng/mL MDMA (Ecstasy), urine              Cutoff 500 ng/mL Cocaine Metabolite, urine          Cutoff 300 ng/mL Opiate + metabolites, urine        Cutoff 300 ng/mL Phencyclidine (PCP), urine         Cutoff 25 ng/mL Cannabinoid, urine                 Cutoff 50 ng/mL Barbiturates + metabolites, urine  Cutoff 200 ng/mL Benzodiazepine, urine               Cutoff 200 ng/mL Methadone, urine                   Cutoff 300 ng/mL  The urine drug screen provides only a preliminary, unconfirmed analytical test result and should not be used for non-medical purposes. Clinical consideration and professional judgment should be applied to any positive drug screen result due to possible interfering substances. A more specific alternate chemical method must be used in order to obtain a confirmed analytical result. Gas chromatography / mass spectrometry (GC/MS) is the preferred confirm atory method. Performed at [facility], [address] [place], [place] [address]   Magnesium     Status: None   Collection Time: 11/05/20 11:02 PM  Result Value Ref Range   Magnesium 2.4 1.7 - 2.4 mg/dL    Comment: Performed at [facility], [address] [place], [place] [address]  Resp Panel by RT-PCR (Flu A&B, Covid) Nasopharyngeal Swab     Status: None   Collection Time: 11/05/20 11:36 PM   Specimen: Nasopharyngeal Swab; Nasopharyngeal(NP) swabs in vial transport medium  Result Value Ref Range   SARS Coronavirus 2 by RT PCR NEGATIVE NEGATIVE    Comment: (NOTE) SARS-CoV-2 target nucleic acids are NOT DETECTED.  The SARS-CoV-2 RNA is generally detectable in upper respiratory specimens during the acute phase of infection. The lowest concentration of SARS-CoV-2 viral copies this assay can detect is 138 copies/mL. A negative result does not preclude SARS-Cov-2 infection and should not be used as the sole basis for treatment or other patient management decisions. A negative result may occur with  improper specimen collection/handling, submission of specimen other than nasopharyngeal swab, presence of viral mutation(s) within the areas  targeted by this assay, and inadequate number of viral copies(<138 copies/mL). A negative result must be combined with clinical observations, patient history, and epidemiological information. The expected  result is Negative.  Fact Sheet for Patients:  BloggerCourse.com  Fact Sheet for Healthcare Providers:  SeriousBroker.it  This test is no t yet approved or cleared by the [place] FDA and  has been authorized for detection and/or diagnosis of SARS-CoV-2 by FDA under an Emergency Use Authorization (EUA). This EUA will remain  in effect (meaning this test can be used) for the duration of the COVID-19 declaration under Section 564(b)(1) of the Act, 21 U.S.C.section 360bbb-3(b)(1), unless the authorization is terminated  or revoked sooner.       Influenza A by PCR NEGATIVE NEGATIVE   Influenza B by PCR NEGATIVE NEGATIVE    Comment: (NOTE) The Xpert Xpress SARS-CoV-2/FLU/RSV plus assay is intended as an aid in the diagnosis of influenza from Nasopharyngeal swab specimens and should not be used as a sole basis for treatment. Nasal washings and aspirates are unacceptable for Xpert Xpress SARS-CoV-2/FLU/RSV testing.  Fact Sheet for Patients: BloggerCourse.com  Fact Sheet for Healthcare Providers: SeriousBroker.it  This test is not yet approved or cleared by the [place] FDA and has been authorized for detection and/or diagnosis of SARS-CoV-2 by FDA under an Emergency Use Authorization (EUA). This EUA will remain in effect (meaning this test can be used) for the duration of the COVID-19 declaration under Section 564(b)(1) of the Act, 21 U.S.C. section 360bbb-3(b)(1), unless the authorization is terminated or revoked.  Performed at [facility], [address] [place], [place] [address]     No current facility-administered medications for this encounter.   Current Outpatient Medications  Medication Sig Dispense Refill   ADDERALL XR 10 MG 24 hr capsule Take 10 mg by mouth every morning.     venlafaxine XR (EFFEXOR-XR) 150 MG 24 hr capsule Take 150 mg by mouth  every morning.      Musculoskeletal: Strength & Muscle Tone: within normal limits Gait & Station: normal Patient leans: N/A    Psychiatric Specialty Exam:  Presentation  General Appearance: Appropriate for Environment  Eye Contact:Good  Speech:Clear and Coherent  Speech Volume:Normal  Handedness:Right   Mood and Affect  Mood:Euthymic  Affect:Appropriate   Thought Process  Thought Processes:Coherent  Descriptions of Associations:Intact  Orientation:Full (Time, Place and Person)  Thought Content:Logical  History of Schizophrenia/Schizoaffective disorder:No  Duration of Psychotic Symptoms:No data recorded Hallucinations:Hallucinations: None  Ideas of Reference:None  Suicidal Thoughts:Suicidal Thoughts: No  Homicidal Thoughts:Homicidal Thoughts: No   Sensorium  Memory:Immediate Good  Judgment:Fair  Insight:Fair   Executive Functions  Concentration:Good  Attention Span:Good  Recall:Fair  Fund of Knowledge:Fair  Language:Fair   Psychomotor Activity  Psychomotor Activity:Psychomotor Activity: Normal   Assets  Assets:Financial Resources/Insurance; Housing; Vocational/Educational; Social Support; Resilience; Physical Health   Sleep  Sleep:Sleep: Good   Physical Exam: Physical Exam ROS Blood pressure 132/89, pulse 80, temperature 98.6 F (37 C), temperature source Oral, resp. rate 16, height 6\' 2"  (1.88 m), weight 92.5 kg, SpO2 98 %. Body mass index is 26.19 kg/m.  Treatment Plan Summary: This is a 20 year old male who presented with law enforcement after making suicidal statements in the context of a breakup of a relationship and being intoxicated. Patient is sober this morning and denying any suicidal thought or intent. Denies homicidal ideations, paranoia, or hallucinations.  Patient denies any prior seizure after drinking. He has protective factors of family, job, housing, and outpatient  psychiatric care. Patient requesting  discharge. Writer strongly encouraged patient to contact outpatient providers for possible medication adjustment and to discuss alcohol use. Case dicussed with EDP and Dr. Toni Amend , who agree with plan.  Disposition: No evidence of imminent risk to self or others at present.   Patient does not meet criteria for psychiatric inpatient admission. Supportive therapy provided about ongoing stressors. Discussed crisis plan, support from social network, calling 911, coming to the Emergency Department, and calling Suicide Hotline.  Vanetta Mulders, NP 11/06/2020 11:55 AM

## 2020-11-06 NOTE — ED Notes (Signed)
Pt on phone trying to call a ride home.

## 2020-11-06 NOTE — ED Notes (Signed)
Verified ride home. Given bag of belongings. Verified correct patient and correct discharge papers given. Pt alert and oriented X 4, stable for discharge. RR even and unlabored, color WNL. Discussed discharge instructions and follow-up as directed. Discharge medications discussed, when prescribed. Pt had opportunity to ask questions, and RN available to provide patient and/or family education.

## 2020-11-06 NOTE — ED Notes (Signed)
breakfast placed at bedside, pt asleep

## 2020-11-06 NOTE — ED Provider Notes (Signed)
-----------------------------------------   11:02 AM on 11/06/2020 -----------------------------------------  The patient has been evaluated by psychiatry and cleared for discharge.  The IVC has been rescinded.  He is stable for discharge at this time.  He denies SI and does not demonstrate any acute danger to self or others.  Return precautions and [facility] referral provided.   Dionne Bucy, MD 11/06/20 1103

## 2020-11-06 NOTE — BH Assessment (Signed)
Comprehensive Clinical Assessment (CCA) Note  11/06/2020 [name] [id number] Recommendations for Services/Supports/Treatments: Consulted with Sean Valdez., NP, who determined pt. is recommended for inpatient treatment. Notified Dr. Zollie Valdez and Sean Batten, RN of disposition recommendation.  Sean Valdez is a 20 year old, Spanish speaking, Hispanic male who presented to [facility] ED voluntarily for alcohol intoxication and SI. Pt has a hx of ADHD, anxiety, and depression. Pt admitted to using alcohol on the 11/05/20. Pt denies current SI, HI, or AV/H. Pt unable to quantify how much he consumes daily, and instead stated, "A lot". Pt's UDS was unremarkable. Pt was impaired, with slurred speech. Pt had a disheveled appearance. Pt's thought processes were intact. Pt was calm and cooperative.  Pt did not appear to be responding to internal stimuli. Pt had no insight and lacks sound judgement. Chief Complaint:  Chief Complaint  Patient presents with   Psychiatric Evaluation   Visit Diagnosis: MDD recurrent, severe Alcohol intoxication    CCA Screening, Triage and Referral (STR)  Patient Reported Information How did you hear about [place]? Legal System  Referral name: No data recorded Referral phone number: No data recorded  Whom do you see for routine medical problems? Other (Comment)  Practice/Facility Name: No data recorded Practice/Facility Phone Number: No data recorded Name of Contact: No data recorded Contact Number: No data recorded Contact Fax Number: No data recorded Prescriber Name: No data recorded Prescriber Address (if known): No data recorded  What Is the Reason for Your Visit/Call Today? SI; Depression; medication noncompliance  How Long Has This Been Causing You Problems? > than 6 months  What Do You Feel Would Help You the Most Today? Treatment for Depression or other mood problem   Have You Recently Been in Any Inpatient Treatment (Hospital/Detox/Crisis Center/28-Day Program)?  No  Name/Location of Program/Hospital:No data recorded How Long Were You There? No data recorded When Were You Discharged? No data recorded  Have You Ever Received Services From [facility] Before? No  Who Do You See at [facility]? No data recorded  Have You Recently Had Any Thoughts About Hurting Yourself? Yes  Are You Planning to Commit Suicide/Harm Yourself At This time? No   Have you Recently Had Thoughts About Hurting Someone Sean Valdez? No  Explanation: No data recorded  Have You Used Any Alcohol or Drugs in the Past 24 Hours? Yes  How Long Ago Did You Use Drugs or Alcohol? No data recorded What Did You Use and How Much? Unknown amount of alcohol   Do You Currently Have a Therapist/Psychiatrist? No  Name of Therapist/Psychiatrist: No data recorded  Have You Been Recently Discharged From Any Office Practice or Programs? No  Explanation of Discharge From Practice/Program: No data recorded    CCA Screening Triage Referral Assessment Type of Contact: Face-to-Face  Is this Initial or Reassessment? No data recorded Date Telepsych consult ordered in [facility]:  No data recorded Time Telepsych consult ordered in [facility]:  No data recorded  Patient Reported Information Reviewed? Yes  Patient Left Without Being Seen? No data recorded Reason for Not Completing Assessment: No data recorded  Collateral Involvement: None provided   Does Patient Have a Court Appointed Legal Guardian? No data recorded Name and Contact of Legal Guardian: No data recorded If Minor and Not Living with Parent(s), Who has Custody? No data recorded Is CPS involved or ever been involved? Never  Is APS involved or ever been involved? Never   Patient Determined To Be At Risk for Harm To Self or Others Based  on Review of Patient Reported Information or Presenting Complaint? Yes, for Self-Harm  Method: No data recorded Availability of Means: No data recorded Intent: No data recorded Notification Required:  No data recorded Additional Information for Danger to Others Potential: No data recorded Additional Comments for Danger to Others Potential: No data recorded Are There Guns or Other Weapons in Your Home? No data recorded Types of Guns/Weapons: No data recorded Are These Weapons Safely Secured?                            No data recorded Who Could Verify You Are Able To Have These Secured: No data recorded Do You Have any Outstanding Charges, Pending Court Dates, Parole/Probation? No data recorded Contacted To Inform of Risk of Harm To Self or Others: No data recorded  Location of Assessment: [facility] ED   Does Patient Present under Involuntary Commitment? Yes  IVC Papers Initial File Date: 11/05/20   [place] of Residence: Sean Valdez   Patient Currently Receiving the Following Services: Medication Management   Determination of Need: Emergent (2 hours)   Options For Referral: Inpatient Hospitalization     CCA Biopsychosocial Intake/Chief Complaint:  Patient presenting to the ED voluntarily for depression and anger  Current Symptoms/Problems: Patient presenting to the ED voluntarily for depression and anger   Patient Reported Schizophrenia/Schizoaffective Diagnosis in Past: No   Strengths: Patient is able to communicate  Preferences: Unknown  Abilities: Patient is able to communicate   Type of Services Patient Feels are Needed: Unnown   Initial Clinical Notes/Concerns: None   Mental Health Symptoms Depression:   Change in energy/activity; Hopelessness; Worthlessness   Duration of Depressive symptoms:  Greater than two weeks   Mania:   None   Anxiety:    Worrying   Psychosis:   None   Duration of Psychotic symptoms: No data recorded  Trauma:   None   Obsessions:   None   Compulsions:   None   Inattention:   None   Hyperactivity/Impulsivity:   N/A   Oppositional/Defiant Behaviors:   None   Emotional Irregularity:   None   Other  Mood/Personality Symptoms:  No data recorded   Mental Status Exam Appearance and self-care  Stature:   Tall   Weight:   Average weight   Clothing:   -- (In scrubs)   Grooming:   Normal   Cosmetic use:   None   Posture/gait:   Normal   Motor activity:   Not Remarkable   Sensorium  Attention:   Normal   Concentration:   Normal   Orientation:   -- (n/a)   Recall/memory:   Normal   Affect and Mood  Affect:   Appropriate   Mood:   Depressed   Relating  Eye contact:   Fleeting   Facial expression:   Depressed   Attitude toward examiner:   Cooperative   Thought and Language  Speech flow:  Slurred   Thought content:   Appropriate to Mood and Circumstances   Preoccupation:   None   Hallucinations:   None   Organization:  No data recorded  [employer] of Knowledge:   Fair   Intelligence:   Average   Abstraction:   Normal   Judgement:   Good   Reality Testing:   Realistic   Insight:   Fair   Decision Making:   Normal   Social Functioning  Social Maturity:   Responsible  Social Judgement:   Normal   Stress  Stressors:   Grief/losses; Relationship   Coping Ability:   Overwhelmed   Skill Deficits:   None   Supports:   Family     Religion:    Leisure/Recreation: Leisure / Recreation Do You Have Hobbies?: No  Exercise/Diet: Exercise/Diet Do You Exercise?: No Have You Gained or Lost A Significant Amount of Weight in the Past Six Months?: No Do You Follow a Special Diet?: No Do You Have Any Trouble Sleeping?: No   CCA Employment/Education Employment/Work Situation: Employment / Work Clinical biochemist has Been Impacted by Current Illness: No Has Patient ever Been in the [employer]?: No  Education: Education Is Patient Currently Attending School?: No Did Theme park manager?: No Did You Have An Individualized Education Program (IIEP): No Did You Have Any Difficulty At [employer]?:  No Patient's Education Has Been Impacted by Current Illness: No   CCA Family/Childhood History Family and Relationship History: Family history Marital status: Single Does patient have children?: No  Childhood History:  Childhood History By whom was/is the patient raised?: Both parents Did patient suffer any verbal/emotional/physical/sexual abuse as a child?: No Did patient suffer from severe childhood neglect?: No Has patient ever been sexually abused/assaulted/raped as an adolescent or adult?: No Was the patient ever a victim of a crime or a disaster?: No Witnessed domestic violence?: No Has patient been affected by domestic violence as an adult?: No  Child/Adolescent Assessment:     CCA Substance Use Alcohol/Drug Use: Alcohol / Drug Use Pain Medications: See MAR Prescriptions: See MAR Over the Counter: See MAR History of alcohol / drug use?: Yes Longest period of sobriety (when/how long): Unknown Negative Consequences of Use: Personal relationships Withdrawal Symptoms: None                         ASAM's:  Six Dimensions of Multidimensional Assessment  Dimension 1:  Acute Intoxication and/or Withdrawal Potential:   Dimension 1:  Description of individual's past and current experiences of substance use and withdrawal: none noted  Dimension 2:  Biomedical Conditions and Complications:      Dimension 3:  Emotional, Behavioral, or Cognitive Conditions and Complications:     Dimension 4:  Readiness to Change:     Dimension 5:  Relapse, Continued use, or Continued Problem Potential:     Dimension 6:  Recovery/Living Environment:     ASAM Severity Score: ASAM's Severity Rating Score: 12  ASAM Recommended Level of Treatment: ASAM Recommended Level of Treatment: Level I Outpatient Treatment   Substance use Disorder (SUD) Substance Use Disorder (SUD)  Checklist Symptoms of Substance Use: Continued use despite persistent or recurrent social, interpersonal  problems, caused or exacerbated by use, Continued use despite having a persistent/recurrent physical/psychological problem caused/exacerbated by use  Recommendations for Services/Supports/Treatments: Recommendations for Services/Supports/Treatments Recommendations For Services/Supports/Treatments: Inpatient Hospitalization  DSM5 Diagnoses: Patient Active Problem List   Diagnosis Date Noted   Alcohol intoxication (HCC) 11/06/2020   MDD (major depressive disorder), recurrent episode, severe (HCC) 06/15/2020      Payton Pr[place], LCAS

## 2021-01-13 ENCOUNTER — Other Ambulatory Visit: Payer: Self-pay

## 2021-01-13 ENCOUNTER — Encounter: Payer: Self-pay | Admitting: *Deleted

## 2021-01-13 ENCOUNTER — Emergency Department
Admission: EM | Admit: 2021-01-13 | Discharge: 2021-01-14 | Disposition: A | Discharge status: Home / Self Care | Payer: BC Managed Care – PPO | Attending: Emergency Medicine | Admitting: Emergency Medicine

## 2021-01-13 DIAGNOSIS — Z046 Encounter for general psychiatric examination, requested by authority: Secondary | ICD-10-CM | POA: Diagnosis present

## 2021-01-13 DIAGNOSIS — F329 Major depressive disorder, single episode, unspecified: Secondary | ICD-10-CM | POA: Diagnosis not present

## 2021-01-13 DIAGNOSIS — Z79899 Other long term (current) drug therapy: Secondary | ICD-10-CM | POA: Insufficient documentation

## 2021-01-13 DIAGNOSIS — F10129 Alcohol abuse with intoxication, unspecified: Secondary | ICD-10-CM | POA: Insufficient documentation

## 2021-01-13 DIAGNOSIS — F10920 Alcohol use, unspecified with intoxication, uncomplicated: Secondary | ICD-10-CM

## 2021-01-13 DIAGNOSIS — F32A Depression, unspecified: Secondary | ICD-10-CM

## 2021-01-13 NOTE — ED Notes (Signed)
Pt unable to take hoop earring out of left ear.

## 2021-01-13 NOTE — ED Triage Notes (Addendum)
Pt brought in by [employer] for SI.  Pt is intoxicated.  Denies drug use.  Pt has been arguing with parents tonight.  Pt is voluntary  pt does not want blood work done.

## 2021-01-13 NOTE — ED Notes (Signed)
Tan shorts White Emergency planning/management officer

## 2021-01-14 LAB — URINE DRUG SCREEN, QUALITATIVE (ARMC ONLY)
Amphetamines, Ur Screen: NOT DETECTED
Barbiturates, Ur Screen: NOT DETECTED
Benzodiazepine, Ur Scrn: NOT DETECTED
Cannabinoid 50 Ng, Ur ~~LOC~~: POSITIVE — AB
Cocaine Metabolite,Ur ~~LOC~~: NOT DETECTED
MDMA (Ecstasy)Ur Screen: NOT DETECTED
Methadone Scn, Ur: NOT DETECTED
Opiate, Ur Screen: NOT DETECTED
Phencyclidine (PCP) Ur S: NOT DETECTED
Tricyclic, Ur Screen: NOT DETECTED

## 2021-01-14 NOTE — ED Provider Notes (Signed)
[facility]  ____________________________________________   Event Date/Time   First MD Initiated Contact with Patient 01/13/21 2343     (approximate)  I have reviewed the triage vital signs and the nursing notes.   HISTORY  Chief Complaint Psychiatric Evaluation    HPI Sean Valdez is a 20 y.o. male with past medical history of ADHD, anxiety and depression who presents due to concern for suicidal ideation.  Apparently patient was drinking alcohol tonight and said that he wanted to kill himself.  Patient denies any suicidal ideation at this time.  He does admit to drinking 3 beers and adamantly says that he has no intent to harm himself.  He does admit that his mom was worried about him and that they were arguing tonight.  Spoke with the patient's mom who says that after drinking he was "speaking crazy" and did mention he wanted to harm himself.  Patient has no other complaints at this time.  He does take medication for depression and has a psychiatrist.         Past Medical History:  Diagnosis Date   ADHD (attention deficit hyperactivity disorder)    Anxiety    Depression     Patient Active Problem List   Diagnosis Date Noted   Alcohol intoxication (HCC) 11/06/2020   MDD (major depressive disorder), recurrent episode, severe (HCC) 06/15/2020    No past surgical history on file.  Prior to Admission medications   Medication Sig Start Date End Date Taking? Authorizing Provider  ADDERALL XR 10 MG 24 hr capsule Take 10 mg by mouth every morning. 10/30/20   [provider]  venlafaxine XR (EFFEXOR-XR) 150 MG 24 hr capsule Take 150 mg by mouth every morning. 10/30/20   [provider]    Allergies Penicillins and Bupropion  Family History  Problem Relation Age of Onset   Hypertension Mother    ADD / ADHD Sister    ADD / ADHD Sister     Social History Social History   Tobacco Use   Smoking status: Never   Smokeless tobacco:  Never  Vaping Use   Vaping Use: Some days  Substance Use Topics   Alcohol use: Yes   Drug use: No    Review of Systems   Review of Systems  Psychiatric/Behavioral:  Negative for dysphoric mood, self-injury and suicidal ideas. The patient is not nervous/anxious.   All other systems reviewed and are negative.  Physical Exam Updated Vital Signs BP 125/75 (BP Location: Left Arm)   Pulse (!) 101   Temp 98.7 F (37.1 C) (Oral)   Resp 18   Ht 6\' 2"  (1.88 m)   Wt 90.7 kg   SpO2 95%   BMI 25.68 kg/m   Physical Exam Vitals and nursing note reviewed.  Constitutional:      General: He is not in acute distress.    Appearance: Normal appearance.  HENT:     Head: Normocephalic and atraumatic.  Eyes:     General: No scleral icterus.    Conjunctiva/sclera: Conjunctivae normal.  Pulmonary:     Effort: Pulmonary effort is normal. No respiratory distress.     Breath sounds: Normal breath sounds. No wheezing.  Musculoskeletal:        General: No deformity or signs of injury.     Cervical back: Normal range of motion.  Skin:    Coloration: Skin is not jaundiced or pale.  Neurological:     General: No focal deficit present.  Mental Status: He is alert and oriented to person, place, and time. Mental status is at baseline.     Comments: Patient is mildly intoxicated but speaking normally, walks with stable gait and is alert and oriented x3  Psychiatric:        Mood and Affect: Mood normal.        Behavior: Behavior normal.     Comments: Patient has no active SI     LABS (all labs ordered are listed, but only abnormal results are displayed)  Labs Reviewed  URINE DRUG SCREEN, QUALITATIVE ([facility] ONLY) - Abnormal; Notable for the following components:      Result Value   Cannabinoid 50 Ng, Ur [place] POSITIVE (*)    All other components within normal limits  COMPREHENSIVE METABOLIC PANEL  ETHANOL  SALICYLATE LEVEL  ACETAMINOPHEN LEVEL  CBC    ____________________________________________  EKG  N/a ____________________________________________  RADIOLOGY Ky Barban, personally viewed and evaluated these images (plain radiographs) as part of my medical decision making, as well as reviewing the written report by the radiologist.  ED MD interpretation:  n/a    ____________________________________________   PROCEDURES  Procedure(s) performed (including Critical Care):  Procedures   ____________________________________________   INITIAL IMPRESSION / ASSESSMENT AND PLAN / ED COURSE     Patient is a 20 year old male with a history of depression and prior suicide attempts who presents with a concern for suicidal ideation.  Apparently when intoxicated tonight he mentioned his parents that he wanted to kill himself was also having an argument with them.  The patient while somewhat intoxicated is alert and oriented with stable gait.  He adamantly denies any intent to harm himself or any plan.  He would like to be discharged.  This point he does have capacity to leave and there is no indication for IVC.  He will be discharged.  Clinical Course as of 01/14/21 0012  Tue Jan 14, 2021  0006 Cannabinoid 50 Ng, Ur [place](!): POSITIVE [KM]    Clinical Course User Index [KM] Georga Hacking, MD     ____________________________________________   FINAL CLINICAL IMPRESSION(S) / ED DIAGNOSES  Final diagnoses:  Depression, unspecified depression type  Alcoholic intoxication without complication [facility])     ED Discharge Orders     None        Note:  This document was prepared using Dragon voice recognition software and may include unintentional dictation errors.    Georga Hacking, MD 01/14/21 [phone]

## 2021-01-14 NOTE — ED Notes (Signed)
Pt belongings and clothing returned. Pt escorted to bathroom and dressed in original clothing that he presented with.

## 2021-01-14 NOTE — ED Notes (Signed)
Pt on phone with parents

## 2021-05-20 ENCOUNTER — Ambulatory Visit (INDEPENDENT_AMBULATORY_CARE_PROVIDER_SITE_OTHER): Payer: BC Managed Care – PPO

## 2021-05-20 ENCOUNTER — Telehealth: Payer: Self-pay

## 2021-05-20 ENCOUNTER — Ambulatory Visit
Admission: EM | Admit: 2021-05-20 | Discharge: 2021-05-20 | Disposition: A | Discharge status: Home / Self Care | Payer: BC Managed Care – PPO | Attending: Family | Admitting: Family

## 2021-05-20 DIAGNOSIS — Z8249 Family history of ischemic heart disease and other diseases of the circulatory system: Secondary | ICD-10-CM | POA: Diagnosis not present

## 2021-05-20 DIAGNOSIS — R0602 Shortness of breath: Secondary | ICD-10-CM | POA: Diagnosis not present

## 2021-05-20 DIAGNOSIS — F1290 Cannabis use, unspecified, uncomplicated: Secondary | ICD-10-CM | POA: Diagnosis not present

## 2021-05-20 DIAGNOSIS — R0789 Other chest pain: Secondary | ICD-10-CM | POA: Diagnosis not present

## 2021-05-20 DIAGNOSIS — R079 Chest pain, unspecified: Secondary | ICD-10-CM | POA: Diagnosis not present

## 2021-05-20 NOTE — ED Provider Notes (Signed)
?[place] ? ? ? ?CSN: [id number] ?Arrival date & time: 05/20/21  1917 ? ? ?  ? ?History   ?Chief Complaint ?Chief Complaint  ?Patient presents with  ? Chest Pain  ? ? ?HPI ?Sean Valdez is a 21 y.o. male.  ? ?21 year old male accompanied by his Mom with concern over central chest pain that has occurred daily over the past week. Also experiencing shortness of breath and pain in the central area of his chest with deep breathing. Woke up with this pain about 1 week ago. No radiation of pain. No strenuous activity. No fever, headache, vision changes, nausea or GI symptoms. No coughing. No history of asthma. Does admit to vaping but stopped recently. Does smoke marijuana daily and drinks alcohol on the weekends. No other illicit drug use. Has not taken any medication for pain/symptoms. Similar pain did occur about 2 to 3 months ago but resolved on own. Mom concerned since older sister has an arrhythmia and his maternal grandmother also has a heart arrhythmia. Other chronic health issues include ADHD, depression and anxiety.  Has been seen at [facility] ER in Oct 2022 with depression, suicidal ideation and alcohol intoxication (alcohol level was 321) and again in Dec 2022 with depressive episode and mild alcohol use. His urine drug screen was positive for Cannabinoids at that time. He does have a psychiatrist- no longer taking ADHD medication but does take Lexapro daily.  ? ?The history is provided by the patient.  ? ?Past Medical History:  ?Diagnosis Date  ? ADHD (attention deficit hyperactivity disorder)   ? Anxiety   ? Depression   ? ? ?Patient Active Problem List  ? Diagnosis Date Noted  ? Alcohol intoxication ([place]) 11/06/2020  ? MDD (major depressive disorder), recurrent episode, severe ([place]) 06/15/2020  ? ? ?History reviewed. No pertinent surgical history. ? ? ? ? ?Home Medications   ? ?Prior to Admission medications   ?Medication Sig Start Date End Date Taking? Authorizing Provider  ?escitalopram (LEXAPRO)  20 MG tablet Take by mouth. 05/09/21  Yes [provider]  ? ? ?Family History ?Family History  ?Problem Relation Age of Onset  ? Hypertension Mother   ? ADD / ADHD Sister   ? ADD / ADHD Sister   ? ? ?Social History ?Social History  ? ?Tobacco Use  ? Smoking status: Never  ? Smokeless tobacco: Never  ?Vaping Use  ? Vaping Use: Every day  ?Substance Use Topics  ? Alcohol use: Yes  ? Drug use: Yes  ?  Types: Marijuana  ? ? ? ?Allergies   ?Penicillins and Bupropion ? ? ?Review of Systems ?Review of Systems  ?Constitutional:  Negative for appetite change, chills, diaphoresis, fatigue, fever and unexpected weight change.  ?HENT:  Negative for congestion, ear pain, hearing loss, nosebleeds, postnasal drip, rhinorrhea, sore throat and trouble swallowing.   ?Eyes:  Negative for photophobia and visual disturbance.  ?Respiratory:  Positive for chest tightness and shortness of breath. Negative for cough and wheezing.   ?Cardiovascular:  Positive for chest pain and palpitations.  ?Gastrointestinal:  Negative for abdominal pain, nausea and vomiting.  ?Musculoskeletal:  Negative for arthralgias, myalgias, neck pain and neck stiffness.  ?Skin:  Negative for color change and rash.  ?Allergic/Immunologic: Positive for environmental allergies. Negative for food allergies and immunocompromised state.  ?Neurological:  Negative for dizziness, tremors, seizures, syncope, facial asymmetry, speech difficulty, weakness, light-headedness, numbness and headaches.  ?Hematological:  Negative for adenopathy. Does not bruise/bleed easily.  ?  Psychiatric/Behavioral:  Negative for self-injury and suicidal ideas (not currently).   ? ? ?Physical Exam ?Triage Vital Signs ?ED Triage Vitals  ?Enc Vitals Group  ?   BP 05/20/21 1936 128/76  ?   Pulse Rate 05/20/21 1936 70  ?   Resp 05/20/21 1936 18  ?   Temp 05/20/21 1936 99 ?F (37.2 ?C)  ?   Temp Source 05/20/21 1936 Oral  ?   SpO2 05/20/21 1936 95 %  ?   Weight 05/20/21 1926 190 lb (86.2 kg)  ?    Height 05/20/21 1926 6\' 2"  (1.88 m)  ?   Head Circumference --   ?   Peak Flow --   ?   Pain Score 05/20/21 1926 7  ?   Pain Loc --   ?   Pain Edu? --   ?   Excl. in [place]? --   ? ?No data found. ? ?Updated Vital Signs ?BP 128/76 (BP Location: Left Arm)   Pulse 70   Temp 99 ?F (37.2 ?C) (Oral)   Resp 18   Ht 6\' 2"  (1.88 m)   Wt 190 lb (86.2 kg)   SpO2 95%   BMI 24.39 kg/m?  ? ?Visual Acuity ?Right Eye Distance:   ?Left Eye Distance:   ?Bilateral Distance:   ? ?Right Eye Near:   ?Left Eye Near:    ?Bilateral Near:    ? ?Physical Exam ?Vitals and nursing note reviewed.  ?Constitutional:   ?   General: He is awake. He is not in acute distress. ?   Appearance: He is well-developed and well-groomed. He is not ill-appearing.  ?   Comments: He is sitting comfortably on the exam table in no acute distress, talking in complete sentences.   ?HENT:  ?   Head: Normocephalic and atraumatic.  ?   Right Ear: Hearing, tympanic membrane, ear canal and external ear normal.  ?   Left Ear: Hearing, tympanic membrane, ear canal and external ear normal.  ?   Nose: Nose normal.  ?   Right Sinus: No maxillary sinus tenderness or frontal sinus tenderness.  ?   Left Sinus: No maxillary sinus tenderness or frontal sinus tenderness.  ?   Mouth/Throat:  ?   Lips: Pink.  ?   Mouth: Mucous membranes are moist.  ?   Pharynx: Oropharynx is clear. Uvula midline.  ?Eyes:  ?   Extraocular Movements: Extraocular movements intact.  ?   Conjunctiva/sclera: Conjunctivae normal.  ?   Pupils: Pupils are equal, round, and reactive to light.  ?Neck:  ?   Vascular: No carotid bruit.  ?Cardiovascular:  ?   Rate and Rhythm: Normal rate and regular rhythm.  ?   Heart sounds: Normal heart sounds. No murmur heard. ?Pulmonary:  ?   Effort: Pulmonary effort is normal. No respiratory distress or retractions.  ?   Breath sounds: Normal breath sounds and air entry. No decreased air movement. No decreased breath sounds, wheezing, rhonchi or rales.  ?Chest:  ?   Chest  wall: Tenderness present. No mass or swelling.  ? ? ?   Comments: Central sternal mid to upper area slightly tender to palpation. No swelling or mass present. No redness or rash.  ?Abdominal:  ?   General: Abdomen is flat.  ?   Palpations: Abdomen is soft.  ?Musculoskeletal:     ?   General: Normal range of motion.  ?   Cervical back: Normal range of motion and neck supple. No  tenderness.  ?Lymphadenopathy:  ?   Cervical: No cervical adenopathy.  ?Skin: ?   General: Skin is warm and dry.  ?   Capillary Refill: Capillary refill takes less than 2 seconds.  ?   Findings: No rash.  ?Neurological:  ?   General: No focal deficit present.  ?   Mental Status: He is alert and oriented to person, place, and time.  ?Psychiatric:     ?   Attention and Perception: Attention normal.     ?   Mood and Affect: Mood and affect normal.     ?   Speech: Speech normal.     ?   Behavior: Behavior is cooperative.     ?   Thought Content: Thought content normal.     ?   Cognition and Memory: Cognition normal.     ?   Judgment: Judgment normal.  ? ? ? ?UC Treatments / Results  ?Labs ?(all labs ordered are listed, but only abnormal results are displayed) ?Labs Reviewed - No data to display ? ?EKG ? ? ?Radiology ?DG Chest 2 View ? ?Result Date: 05/20/2021 ?CLINICAL DATA:  Central chest pain, short of breath for 1 week EXAM: CHEST - 2 VIEW COMPARISON:  None. FINDINGS: The heart size and mediastinal contours are within normal limits. Both lungs are clear. The visualized skeletal structures are unremarkable. IMPRESSION: No active cardiopulmonary disease. Electronically Signed   By: Randa Ngo M.D.   On: 05/20/2021 20:20   ? ?Procedures ?ED EKG ? ?Date/Time: 05/20/2021 8:31 PM ?Performed by: Katy Apo, NP ?Authorized by: Katy Apo, NP  ? ?ECG reviewed by ED Physician in the absence of a cardiologist: no   ?Previous ECG:  ?  Previous ECG:  Compared to current (no significant changes from previous ECG done at [facility] in Dec 2022) ?   Similarity:  No change ?Interpretation:  ?  Interpretation: normal   ?Rate:  ?  ECG rate:  63 ?  ECG rate assessment: normal   ?Rhythm:  ?  Rhythm: sinus rhythm   ?Ectopy:  ?  Ectopy: none   ?QRS:  ?  QRS ax

## 2021-05-20 NOTE — Discharge Instructions (Addendum)
Recommend trial OTC Ibuprofen 600mg  every 8 hours as needed for pain. Continue to monitor symptoms. If pain gets worse or shortness of breath worsens, go to the ER ASAP. Otherwise, call a Cardiologist tomorrow to schedule appointment for further evaluation.  ?

## 2021-05-20 NOTE — ED Triage Notes (Signed)
Pt c/o chest pain. Pt states that it feels like something is pressing on his chest and he is having SOB x1week.  ? ?Pt states that pressure was always most intense from 11am to 2pm.  ? ?Pt has been Vaping and has had an allergy flare up.  ?

## 2021-05-20 NOTE — Telephone Encounter (Signed)
Patient called requesting an appointment. I advised patient to go to urgent care due to no availability for acute visits today. Per patient "heart hurts for 1 week, was on a cruise could not go to the doctor". ?

## 2021-05-23 ENCOUNTER — Ambulatory Visit: Payer: BC Managed Care – PPO | Admitting: Nurse Practitioner

## 2021-07-02 ENCOUNTER — Emergency Department
Admission: EM | Admit: 2021-07-02 | Discharge: 2021-07-02 | Disposition: A | Discharge status: Home / Self Care | Payer: BC Managed Care – PPO | Attending: Emergency Medicine | Admitting: Emergency Medicine

## 2021-07-02 ENCOUNTER — Encounter: Payer: Self-pay | Admitting: Medical Oncology

## 2021-07-02 ENCOUNTER — Emergency Department: Payer: BC Managed Care – PPO

## 2021-07-02 DIAGNOSIS — R079 Chest pain, unspecified: Secondary | ICD-10-CM | POA: Diagnosis present

## 2021-07-02 DIAGNOSIS — R748 Abnormal levels of other serum enzymes: Secondary | ICD-10-CM | POA: Insufficient documentation

## 2021-07-02 LAB — HEPATIC FUNCTION PANEL
ALT: 35 U/L (ref 0–44)
AST: 31 U/L (ref 15–41)
Albumin: 4.1 g/dL (ref 3.5–5.0)
Alkaline Phosphatase: 66 U/L (ref 38–126)
Bilirubin, Direct: <0.1 mg/dL (ref 0.0–0.2)
Total Bilirubin: 0.8 mg/dL (ref 0.3–1.2)
Total Protein: 7.2 g/dL (ref 6.5–8.1)

## 2021-07-02 LAB — BASIC METABOLIC PANEL
Anion gap: 6 (ref 5–15)
BUN: 13 mg/dL (ref 6–20)
CO2: 28 mmol/L (ref 22–32)
Calcium: 9.3 mg/dL (ref 8.9–10.3)
Chloride: 100 mmol/L (ref 98–111)
Creatinine, Ser: 0.83 mg/dL (ref 0.61–1.24)
GFR, Estimated: >60 mL/min (ref >60)
Glucose, Bld: 93 mg/dL (ref 70–99)
Potassium: 4 mmol/L (ref 3.5–5.1)
Sodium: 134 mmol/L — ABNORMAL LOW (ref 135–145)

## 2021-07-02 LAB — CBC
HCT: 44.2 % (ref 39.0–52.0)
Hemoglobin: 15.4 g/dL (ref 13.0–17.0)
MCH: 31.1 pg (ref 26.0–34.0)
MCHC: 34.8 g/dL (ref 30.0–36.0)
MCV: 89.3 fL (ref 80.0–100.0)
Platelets: 250 10*3/uL (ref 150–400)
RBC: 4.95 MIL/uL (ref 4.22–5.81)
RDW: 11.9 % (ref 11.5–15.5)
WBC: 11.2 10*3/uL — ABNORMAL HIGH (ref 4.0–10.5)
nRBC: 0 % (ref 0.0–0.2)

## 2021-07-02 LAB — LIPASE, BLOOD: Lipase: 250 U/L — ABNORMAL HIGH (ref 11–51)

## 2021-07-02 LAB — TROPONIN I (HIGH SENSITIVITY)
Troponin I (High Sensitivity): 2 ng/L (ref <18)
Troponin I (High Sensitivity): <2 ng/L (ref <18)

## 2021-07-02 LAB — D-DIMER, QUANTITATIVE: D-Dimer, Quant: <0.27 ug/mL-FEU (ref 0.00–0.50)

## 2021-07-02 LAB — TSH: TSH: 1.122 u[IU]/mL (ref 0.350–4.500)

## 2021-07-02 MED ORDER — LIDOCAINE VISCOUS HCL 2 % MT SOLN
15.0000 mL | Freq: Once | OROMUCOSAL | Status: AC
  Administered 2021-07-02: 15 mL via ORAL
  Filled 2021-07-02: qty 15

## 2021-07-02 MED ORDER — ALUM & MAG HYDROXIDE-SIMETH 200-200-20 MG/5ML PO SUSP
30.0000 mL | Freq: Once | ORAL | Status: AC
  Administered 2021-07-02: 30 mL via ORAL
  Filled 2021-07-02: qty 30

## 2021-07-02 NOTE — ED Triage Notes (Signed)
Pt reports that he has been having central chest pressure off and on x 1 month. Pt reports that he was referred to cardiologist but hasnt seen them yet. Pt reports this am after drinking redbull he began having the pain again.

## 2021-07-02 NOTE — ED Notes (Signed)
See triage note. Pt's skin dry, resp reg/unlabored, pt ambulatory to room, steady, and now calmly sitting on stretcher. Pt's mother at bedside.

## 2021-07-02 NOTE — ED Provider Notes (Signed)
[facility] Provider Note    Event Date/Time   First MD Initiated Contact with Patient 07/02/21 1025     (approximate)   History   Chest Pain   HPI  Sean Valdez is a 21 y.o. male with a past medical history of depression, alcohol use who presents today for evaluation of chest pain.  Patient reports that he has had chest pain intermittently over the past few months month.  Patient was most recently evaluated for this at urgent care on 05/20/2021.  At that time he was diagnosed with chest wall inflammation or costochondritis.  Patient reports that he gets chest pain intermittently.  Today he noticed it while driving.  He also notes that this was right after drinking an energy drink.  He denies associated nausea or diaphoresis.  He reports that when he gets this pain he feels anxious.  He denies any abdominal pain or back pain.  He has not had any calf pain or leg swelling.  No family history of PE/DVT.  No family history of sudden cardiac death.  Patient reports that he also drinks alcohol, estimates that he drinks 5 shots 3 days a week.  He reports that he is also a daily marijuana smoker.  He does not smoke cigarettes.  He reports that he has stopped vaping.  He currently denies any pain.  He reports that he eats a lot of spicy food.   Patient Active Problem List   Diagnosis Date Noted   Alcohol intoxication ([place]) 11/06/2020   MDD (major depressive disorder), recurrent episode, severe ([place]) 06/15/2020          Physical Exam   Triage Vital Signs: ED Triage Vitals  Enc Vitals Group     BP 07/02/21 0918 122/79     Pulse Rate 07/02/21 0918 79     Resp 07/02/21 0918 18     Temp 07/02/21 0918 (!) 97.5 F (36.4 C)     Temp Source 07/02/21 0918 Oral     SpO2 07/02/21 0918 96 %     Weight 07/02/21 0924 207 lb (93.9 kg)     Height 07/02/21 0924 6\' 2"  (1.88 m)     Head Circumference --      Peak Flow --      Pain Score 07/02/21 0924 7     Pain Loc --       Pain Edu? --      Excl. in [place]? --     Most recent vital signs: Vitals:   07/02/21 0918 07/02/21 0923  BP: 122/79   Pulse: 79   Resp: 18   Temp: (!) 97.5 F (36.4 C) (!) 97.5 F (36.4 C)  SpO2: 96%     Physical Exam Vitals and nursing note reviewed.  Constitutional:      General: Awake and alert. No acute distress.    Appearance: Normal appearance. He is well-developed and overweight.  HENT:     Head: Normocephalic and atraumatic.     Mouth: Mucous membranes are moist.  Eyes:     General: PERRL. Normal EOMs        Right eye: No discharge.        Left eye: No discharge.     Conjunctiva/sclera: Conjunctivae normal.  Cardiovascular:     Rate and Rhythm: Normal rate and regular rhythm.     Pulses: Normal pulses, equal in all 4 extremities    Heart sounds: Normal heart sounds.  Anterior chest wall tenderness  present without rash, erythema, or ecchymosis Pulmonary:     Effort: Pulmonary effort is normal. No respiratory distress.     Breath sounds: Normal breath sounds.  Able to speak easily in complete sentences Abdominal:     Abdomen is soft. There is no abdominal tenderness. No rebound or guarding. No distention.   Musculoskeletal:        General: No swelling. Normal range of motion.     Cervical back: Normal range of motion and neck supple.  Lymphadenopathy:     Cervical: No cervical adenopathy.  Skin:    General: Skin is warm and dry.     Capillary Refill: Capillary refill takes less than 2 seconds.     Findings: No rash.  Neurological:     Mental Status: He is alert.      ED Results / Procedures / Treatments   Labs (all labs ordered are listed, but only abnormal results are displayed) Labs Reviewed  BASIC METABOLIC PANEL - Abnormal; Notable for the following components:      Result Value   Sodium 134 (*)    All other components within normal limits  CBC - Abnormal; Notable for the following components:   WBC 11.2 (*)    All other components within normal  limits  LIPASE, BLOOD - Abnormal; Notable for the following components:   Lipase 250 (*)    All other components within normal limits  D-DIMER, QUANTITATIVE  TSH  HEPATIC FUNCTION PANEL  TROPONIN I (HIGH SENSITIVITY)  TROPONIN I (HIGH SENSITIVITY)     EKG     RADIOLOGY I independently reviewed x-ray and agree with radiologist findings    PROCEDURES:  Critical Care performed:   Procedures   MEDICATIONS ORDERED IN ED: Medications  alum & mag hydroxide-simeth (MAALOX/MYLANTA) 200-200-20 MG/5ML suspension 30 mL (30 mLs Oral Given 07/02/21 1052)    And  lidocaine (XYLOCAINE) 2 % viscous mouth solution 15 mL (15 mLs Oral Given 07/02/21 1052)     IMPRESSION / MDM / ASSESSMENT AND PLAN / ED COURSE  I reviewed the triage vital signs and the nursing notes.   Differential diagnosis includes, but is not limited to, costochondritis, gastritis, peptic ulcer disease, esophagitis, biliary colic, pancreatitis, acute coronary syndrome, pulmonary embolism, pneumothorax.  Patient is awake and alert, hemodynamically stable and afebrile.  He has reproducible chest wall tenderness.  EKG obtained upon arrival demonstrates normal sinus rhythm without ischemic signs or changes, similar to previous obtained last month.  Chest x-ray demonstrates no acute cardiopulmonary abnormality.  Labs obtained at triage are overall reassuring with negative troponin.  After discussion with patient I added on LFTs and lipase as well as D-dimer and TSH.  D-dimer and TSH are negative.  Wells score is 0, negative D-dimer, unlikely to be pulmonary embolism.  Pulses are equal in all 4 extremities, no radiation of pain to back, unlikely vascular catastrophe.  Lipase is minimally elevated to 250, likely in the setting of his alcohol and energy drink use.  His repeat troponin is undetectable.  We discussed decreased alcohol intake in addition to bland diet and decreased energy drink use for pancreatic rest.  Patient was treated  with GI cocktail with improvement of his symptoms, suggestive of possible component of gastritis which is quite likely in the setting of high alcohol, caffeine, and spicy food diet.  We also discussed strict return precautions and the importance of close outpatient follow-up.  He has a follow-up appoint with cardiologist.  All findings and recommendations discussed  with patient and mother who agrees with plan.  He was discharged in stable condition.   Patient's presentation is most consistent with acute complicated illness / injury requiring diagnostic workup.   `   FINAL CLINICAL IMPRESSION(S) / ED DIAGNOSES   Final diagnoses:  Nonspecific chest pain  Elevated lipase     Rx / DC Orders   ED Discharge Orders     None        Note:  This document was prepared using Dragon voice recognition software and may include unintentional dictation errors.   Emeline Gins 07/02/21 1306    Carrie Mew, MD 07/02/21 [phone]

## 2021-07-02 NOTE — ED Notes (Signed)
Called lab who states to collect blue tube and an extra lt green tube.

## 2021-07-02 NOTE — Discharge Instructions (Signed)
Your chest x-ray and EKG are normal appearing.  Your labs are overall reassuring with exception of a mild elevation in your pancreatic test.  Please try to cut down on your alcohol intake as well as your energy drink intake.  Eat a bland diet over the next several days to weeks to help rest her pancreas.  Please return the emergency department immediately for any new, worsening, or change in symptoms or other concerns.

## 2021-07-02 NOTE — ED Notes (Signed)
Pt reports when his CP comes on it stays throughout the day but is intermittent between days; reports SOB sometimes with his CP; denies certain triggers other than general activity; denies fever; pt resting calmly on stretcher; visitor remains with pt. Pt denies jaw, shoulder blade and arm pain or numbness.

## 2021-12-07 ENCOUNTER — Emergency Department
Admission: EM | Admit: 2021-12-07 | Discharge: 2021-12-07 | Disposition: A | Discharge status: Home / Self Care | Payer: BC Managed Care – PPO | Attending: Orthopedic Surgery | Admitting: Orthopedic Surgery

## 2021-12-07 ENCOUNTER — Other Ambulatory Visit: Payer: Self-pay

## 2021-12-07 DIAGNOSIS — S8991XA Unspecified injury of right lower leg, initial encounter: Secondary | ICD-10-CM | POA: Diagnosis present

## 2021-12-07 DIAGNOSIS — S81811A Laceration without foreign body, right lower leg, initial encounter: Secondary | ICD-10-CM | POA: Diagnosis not present

## 2021-12-07 DIAGNOSIS — W2203XA Walked into furniture, initial encounter: Secondary | ICD-10-CM | POA: Insufficient documentation

## 2021-12-07 MED ORDER — LIDOCAINE-EPINEPHRINE 1 %-1:100000 IJ SOLN
30.0000 mL | Freq: Once | INTRAMUSCULAR | Status: AC
  Administered 2021-12-07: 30 mL

## 2021-12-07 MED ORDER — SULFAMETHOXAZOLE-TRIMETHOPRIM 800-160 MG PO TABS: 1.0000 | ORAL_TABLET | Freq: Two times a day (BID) | ORAL | 0 refills | Status: DC

## 2021-12-07 NOTE — Discharge Instructions (Signed)
Please keep laceration site clean and covered.  Take antibiotics as prescribed.  You may shower and get wet but do not submerge underwater.  Follow-up with walk-in clinic, urgent care or PCPs office in 8 to 10 days for suture removal

## 2021-12-07 NOTE — ED Triage Notes (Signed)
Pt to ED with sister, pt was walking in house and walked into a piece of sharp metal. Has laceration to RLE, arrived with tight dressing/wrapping to leg. Unwrapped, pt does have bleeding laceration that is controlled with pressure. Rewrapped, less tightly.

## 2021-12-07 NOTE — ED Provider Triage Note (Signed)
Emergency Medicine Provider Triage Evaluation Note  Sean Valdez, a 21 y.o. male  was evaluated in triage.  Pt complains of right leg injury after he into a sharp piece of metal.  He presents with a laceration to the anterior right shin.  Presents with a pressure bandage in place, noting some numbness and tingling to the foot distally.  No other injuries reported at this time.  Review of Systems  Positive: RLE laceration  Negative: Joint  pain   Physical Exam  BP 111/76   Pulse 62   Temp 98.6 F (37 C) (Oral)   Resp 16   Ht 6\' 2"  (1.88 m)   Wt 97.5 kg   SpO2 98%   BMI 27.60 kg/m  Gen:   Awake, no distress  NAD Resp:  Normal effort CTA MSK:   Moves extremities without difficulty RLE with an anterior shin laceration noted. Other:    Medical Decision Making  Medically screening exam initiated at 6:57 PM.  Appropriate orders placed.  REIN POPOV was informed that the remainder of the evaluation will be completed by another provider, this initial triage assessment does not replace that evaluation, and the importance of remaining in the ED until their evaluation is complete.  Patient to the ED for evaluation of an anterior right shin laceration.   Melvenia Needles, PA-C 12/07/21 1858

## 2021-12-07 NOTE — ED Provider Notes (Signed)
[facility] EMERGENCY DEPARTMENT Provider Note   CSN: [id number] Arrival date & time: 12/07/21  1756     History  Chief Complaint  Patient presents with   Leg Injury    Sean Valdez is a 21 y.o. male.  Presents to the emergency department for evaluation of right leg injury.  Patient was walking in his house earlier today and hit his right lower leg on a sharp corner of furniture.  Tetanus up-to-date.  Bleeding well controlled.  Laceration present.  Ambulatory with no pain along the tib-fib region  HPI     Home Medications Prior to Admission medications   Medication Sig Start Date End Date Taking? Authorizing Provider  sulfamethoxazole-trimethoprim (BACTRIM DS) 800-160 MG tablet Take 1 tablet by mouth 2 (two) times daily. 12/07/21  Yes Evon Slack, PA-C  escitalopram (LEXAPRO) 20 MG tablet Take by mouth. 05/09/21   [provider]      Allergies    Penicillins and Bupropion    Review of Systems   Review of Systems  Physical Exam Updated Vital Signs BP 111/76   Pulse 62   Temp 98.6 F (37 C) (Oral)   Resp 16   Ht 6\' 2"  (1.88 m)   Wt 97.5 kg   SpO2 98%   BMI 27.60 kg/m  Physical Exam Constitutional:      Appearance: He is well-developed.  HENT:     Head: Normocephalic and atraumatic.  Eyes:     Extraocular Movements: Extraocular movements intact.     Conjunctiva/sclera: Conjunctivae normal.     Pupils: Pupils are equal, round, and reactive to light.  Cardiovascular:     Rate and Rhythm: Normal rate.  Pulmonary:     Effort: Pulmonary effort is normal. No respiratory distress.  Musculoskeletal:        General: Normal range of motion.     Cervical back: Normal range of motion.     Comments: Patient with 3 cm laceration right anterior mid tibia.  No visible or palpable foreign body.  Bleeding well controlled.  Ankle plantarflexion dorsiflexion is intact.  Laceration superficial.  Skin:    General: Skin is warm.     Findings:  No rash.  Neurological:     Mental Status: He is alert and oriented to person, place, and time.  Psychiatric:        Behavior: Behavior normal.        Thought Content: Thought content normal.     ED Results / Procedures / Treatments   Labs (all labs ordered are listed, but only abnormal results are displayed) Labs Reviewed - No data to display  EKG None  Radiology No results found.  Procedures . Laceration Repair  Date/Time: 12/07/2021 10:57 PM  Performed by: 13/06/2021, PA-C Authorized by: Evon Slack, PA-C   Consent:    Consent obtained:  Verbal   Consent given by:  Patient Laceration details:    Location:  Leg   Leg location:  R lower leg   Length (cm):  3   Depth (mm):  3 Exploration:    Imaging outcome: foreign body not noted     Wound exploration: wound explored through full range of motion and entire depth of wound visualized   Treatment:    Area cleansed with:  Povidone-iodine and saline   Amount of cleaning:  Standard   Irrigation method:  Pressure wash Skin repair:    Repair method:  Sutures   Suture size:  4-0  Suture material:  Nylon   Suture technique:  Simple interrupted   Number of sutures:  6 Approximation:    Approximation:  Close Repair type:    Repair type:  Simple Post-procedure details:    Dressing:  Bulky dressing   Procedure completion:  Tolerated     Medications Ordered in ED Medications  lidocaine-EPINEPHrine (XYLOCAINE W/EPI) 1 %-1:100000 (with pres) injection 30 mL (30 mLs Infiltration Given by Other 12/07/21 2204)    ED Course/ Medical Decision Making/ A&P                           Medical Decision Making Risk Prescription drug management.   21 year old male with lacerations to right lower leg after accidental trauma while bumping into the corner of a piece of furniture in his house.  Tetanus is up-to-date.  No antalgic gait.  Patient with superficial laceration to the right mid tibia.  Laceration thoroughly  cleansed and irrigated and repaired with six 4-0 nylon sutures.  Patient educated on wound care.  He is placed on prophylactic antibiotics.  He understands signs symptoms return to the ER for. Final Clinical Impression(s) / ED Diagnoses Final diagnoses:  Leg laceration, right, initial encounter    Rx / DC Orders ED Discharge Orders          Ordered    sulfamethoxazole-trimethoprim (BACTRIM DS) 800-160 MG tablet  2 times daily        12/07/21 2242              Duanne Guess, PA-C 12/07/21 2259    Blake Divine, MD 12/08/21 2313

## 2022-02-09 ENCOUNTER — Ambulatory Visit: Payer: BC Managed Care – PPO | Admitting: Nurse Practitioner

## 2022-02-09 ENCOUNTER — Encounter: Payer: Self-pay | Admitting: Nurse Practitioner

## 2022-02-09 ENCOUNTER — Ambulatory Visit
Admission: RE | Admit: 2022-02-09 | Discharge: 2022-02-09 | Disposition: A | Discharge status: Home / Self Care | Payer: BC Managed Care – PPO | Source: Ambulatory Visit | Attending: Nurse Practitioner | Admitting: Nurse Practitioner

## 2022-02-09 VITALS — BP 125/72 | HR 67 | Temp 97.9°F | Resp 16 | Ht 75.0 in | Wt 205.4 lb

## 2022-02-09 DIAGNOSIS — G8929 Other chronic pain: Secondary | ICD-10-CM

## 2022-02-09 DIAGNOSIS — M549 Dorsalgia, unspecified: Secondary | ICD-10-CM | POA: Diagnosis not present

## 2022-02-09 DIAGNOSIS — M545 Low back pain, unspecified: Secondary | ICD-10-CM | POA: Diagnosis not present
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
## 2022-02-11 ENCOUNTER — Telehealth: Payer: Self-pay

## 2022-02-11 NOTE — Telephone Encounter (Signed)
Patient notified

## 2022-02-11 NOTE — Telephone Encounter (Signed)
-----   Message from Jonetta Osgood, NP sent at 02/11/2022  7:56 AM EST ----- Please call the patient and let him know that his xrays of his mid and lower back were normal. If the pain does not improve or gets worse, I can refer him to orthopedic specialist.

## 2022-02-11 NOTE — Progress Notes (Signed)
Please call the patient and let him know that his xrays of his mid and lower back were normal. If the pain does not improve or gets worse, I can refer him to orthopedic specialist.

## 2023-04-27 ENCOUNTER — Ambulatory Visit: Admitting: Nurse Practitioner

## 2023-09-21 ENCOUNTER — Telehealth: Payer: Self-pay | Admitting: Nurse Practitioner

## 2023-09-21 NOTE — Telephone Encounter (Signed)
 Left vm to confirm 09/28/23 appointment-Toni

## 2023-09-28 ENCOUNTER — Encounter: Payer: Self-pay | Admitting: Nurse Practitioner

## 2023-09-28 ENCOUNTER — Ambulatory Visit (INDEPENDENT_AMBULATORY_CARE_PROVIDER_SITE_OTHER): Admitting: Nurse Practitioner

## 2023-09-28 VITALS — BP 126/74 | HR 100 | Temp 97.8°F | Resp 16 | Ht 74.0 in | Wt 219.6 lb

## 2023-09-28 DIAGNOSIS — K869 Disease of pancreas, unspecified: Secondary | ICD-10-CM

## 2023-09-28 DIAGNOSIS — G8929 Other chronic pain: Secondary | ICD-10-CM

## 2023-09-28 DIAGNOSIS — M549 Dorsalgia, unspecified: Secondary | ICD-10-CM | POA: Diagnosis not present

## 2023-09-28 DIAGNOSIS — Z0001 Encounter for general adult medical examination with abnormal findings: Secondary | ICD-10-CM | POA: Diagnosis not present

## 2023-09-28 DIAGNOSIS — E782 Mixed hyperlipidemia: Secondary | ICD-10-CM

## 2023-09-28 DIAGNOSIS — Z23 Encounter for immunization: Secondary | ICD-10-CM

## 2023-09-28 DIAGNOSIS — L700 Acne vulgaris: Secondary | ICD-10-CM | POA: Diagnosis not present

## 2023-09-28 DIAGNOSIS — E538 Deficiency of other specified B group vitamins: Secondary | ICD-10-CM

## 2023-09-28 DIAGNOSIS — E559 Vitamin D deficiency, unspecified: Secondary | ICD-10-CM

## 2023-09-28 DIAGNOSIS — M545 Low back pain, unspecified: Secondary | ICD-10-CM | POA: Diagnosis not present

## 2023-09-28 DIAGNOSIS — Z833 Family history of diabetes mellitus: Secondary | ICD-10-CM

## 2023-09-28 MED ORDER — TETANUS-DIPHTH-ACELL PERTUSSIS 5-2.5-18.5 LF-MCG/0.5 IM SUSP: 0.5000 mL | Freq: Once | INTRAMUSCULAR | 0 refills | Status: AC | PRN

## 2023-09-28 NOTE — Progress Notes (Signed)
 [facility] [address] [place], [place] [address]  Internal MEDICINE  Office Visit Note  Patient Name: Sean Valdez  [id number]  [id number]  Date of Service: 09/28/2023  Chief Complaint  Patient presents with   Annual Exam   Depression    HPI Madoc presents for an annual well visit and physical exam.  Well-appearing 23 y.o. male with ADHD and history of depression. He also has low back and mid back pain. He has acne that he wants to see a dermatologist about. Family history of type 2 diabetes  Labs: due for routine labs  New or worsening pain: chronic low back and mid back pain.      Current Medication: Outpatient Encounter Medications as of 09/28/2023  Medication Sig   amphetamine-dextroamphetamine (ADDERALL XR) 20 MG 24 hr capsule Take 20 mg by mouth every morning.   mirtazapine (REMERON) 30 MG tablet Take 30 mg by mouth at bedtime.   Tdap (BOOSTRIX) 5-2.5-18.5 LF-MCG/0.5 injection Inject 0.5 mLs into the muscle once as needed for up to 1 dose for immunization.   tretinoin (RETIN-A) 0.05 % cream SMARTSIG:1 sparingly Topical Every Night   escitalopram (LEXAPRO) 20 MG tablet Take by mouth.   [DISCONTINUED] sulfamethoxazole -trimethoprim  (BACTRIM  DS) 800-160 MG tablet Take 1 tablet by mouth 2 (two) times daily.   No facility-administered encounter medications on file as of 09/28/2023.    Surgical History: History reviewed. No pertinent surgical history.  Medical History: Past Medical History:  Diagnosis Date   ADHD (attention deficit hyperactivity disorder)    Anxiety    Depression     Family History: Family History  Problem Relation Age of Onset   Hypertension Mother    ADD / ADHD Sister    ADD / ADHD Sister     Social History   Socioeconomic History   Marital status: Single    Spouse name: Not on file   Number of children: Not on file   Years of education: Not on file   Highest education level: Not on file  Occupational History   Not on  file  Tobacco Use   Smoking status: Every Day    Types: E-cigarettes   Smokeless tobacco: Never   Tobacco comments:    Vaping  Vaping Use   Vaping status: Every Day  Substance and Sexual Activity   Alcohol use: Not Currently   Drug use: Yes    Types: Marijuana   Sexual activity: Not on file  Other Topics Concern   Not on file  Social History Narrative   Not on file   Social Drivers of Health   Financial Resource Strain: Not on file  Food Insecurity: Not on file  Transportation Needs: Not on file  Physical Activity: Not on file  Stress: Not on file  Social Connections: Not on file  Intimate Partner Violence: Not on file      Review of Systems  Constitutional:  Negative for activity change, appetite change, chills, fatigue, fever and unexpected weight change.  HENT: Negative.  Negative for congestion, ear pain, rhinorrhea, sore throat and trouble swallowing.   Eyes: Negative.   Respiratory: Negative.  Negative for cough, chest tightness, shortness of breath and wheezing.   Cardiovascular: Negative.  Negative for chest pain and palpitations.  Gastrointestinal: Negative.  Negative for abdominal pain, blood in stool, constipation, diarrhea, nausea and vomiting.  Endocrine: Negative.   Genitourinary: Negative.  Negative for difficulty urinating, dysuria, frequency, hematuria and urgency.  Musculoskeletal: Negative.  Negative for arthralgias,  back pain, joint swelling, myalgias and neck pain.  Skin: Negative.  Negative for rash and wound.  Allergic/Immunologic: Negative.  Negative for immunocompromised state.  Neurological: Negative.  Negative for dizziness, seizures, numbness and headaches.  Hematological: Negative.   Psychiatric/Behavioral: Negative.  Negative for behavioral problems, self-injury and suicidal ideas. The patient is not nervous/anxious.     Vital Signs: BP 126/74   Pulse 100   Temp 97.8 F (36.6 C)   Resp 16   Ht 6' 2 (1.88 m)   Wt 219 lb 9.6 oz  (99.6 kg)   SpO2 97%   BMI 28.19 kg/m    Physical Exam Vitals reviewed.  Constitutional:      General: He is not in acute distress.    Appearance: Normal appearance. He is not ill-appearing.  HENT:     Head: Normocephalic and atraumatic.     Right Ear: Tympanic membrane, ear canal and external ear normal.     Left Ear: Tympanic membrane, ear canal and external ear normal.     Nose: Nose normal. No congestion or rhinorrhea.     Mouth/Throat:     Mouth: Mucous membranes are moist.     Pharynx: Oropharynx is clear. No oropharyngeal exudate or posterior oropharyngeal erythema.  Eyes:     Extraocular Movements: Extraocular movements intact.     Conjunctiva/sclera: Conjunctivae normal.     Pupils: Pupils are equal, round, and reactive to light.  Cardiovascular:     Rate and Rhythm: Normal rate and regular rhythm.     Pulses: Normal pulses.     Heart sounds: Normal heart sounds. No murmur heard. Pulmonary:     Effort: Pulmonary effort is normal. No respiratory distress.     Breath sounds: Normal breath sounds. No wheezing.  Abdominal:     General: Bowel sounds are normal. There is no distension.     Palpations: Abdomen is soft. There is no mass.     Tenderness: There is no abdominal tenderness. There is no guarding or rebound.     Hernia: No hernia is present.  Musculoskeletal:        General: Normal range of motion.     Cervical back: Normal, normal range of motion and neck supple.     Thoracic back: Spasms, tenderness and bony tenderness present.     Lumbar back: Spasms, tenderness and bony tenderness present.     Right lower leg: No edema.     Left lower leg: No edema.  Skin:    General: Skin is warm and dry.     Capillary Refill: Capillary refill takes less than 2 seconds.  Neurological:     Mental Status: He is alert and oriented to person, place, and time.     Cranial Nerves: No cranial nerve deficit.     Coordination: Coordination normal.     Gait: Gait normal.   Psychiatric:        Mood and Affect: Mood normal.        Behavior: Behavior normal.        Thought Content: Thought content normal.        Judgment: Judgment normal.        Assessment/Plan: 1. Encounter for routine adult health examination with abnormal findings (Primary) Age-appropriate preventive screenings and vaccinations discussed, annual physical exam completed. Routine labs for health maintenance ordered, see below. PHM updated.   - amphetamine-dextroamphetamine (ADDERALL XR) 20 MG 24 hr capsule; Take 20 mg by mouth every morning. - mirtazapine (REMERON) 30 MG  tablet; Take 30 mg by mouth at bedtime.  2. Acne vulgaris Referred to dermatology  - tretinoin (RETIN-A) 0.05 % cream; SMARTSIG:1 sparingly Topical Every Night - Ambulatory referral to Dermatology  3. Chronic midline low back pain without sciatica Referred to orthopedic surgery  - Ambulatory referral to Orthopedic Surgery  4. Mid back pain, chronic Referred to orthopedic surgery  - Ambulatory referral to Orthopedic Surgery  5. Disorder of pancreas Routine labs ordered  - CBC with Differential/Platelet - CMP14+EGFR - Lipid Profile - Lipase - Amylase - Hgb A1C w/o eAG - Vitamin D (25 hydroxy) - B12 and Folate Panel  6. Mixed hyperlipidemia Routine labs ordered  - CBC with Differential/Platelet - CMP14+EGFR - Lipid Profile - Lipase - Amylase - Hgb A1C w/o eAG - Vitamin D (25 hydroxy) - B12 and Folate Panel  7. B12 deficiency Routine labs ordered  - CBC with Differential/Platelet - CMP14+EGFR - Lipid Profile - Lipase - Amylase - Hgb A1C w/o eAG - Vitamin D (25 hydroxy) - B12 and Folate Panel  8. Vitamin D deficiency Routine labs ordered  - CBC with Differential/Platelet - CMP14+EGFR - Lipid Profile - Lipase - Amylase - Hgb A1C w/o eAG - Vitamin D (25 hydroxy) - B12 and Folate Panel  9. Need for vaccination - Tdap (BOOSTRIX) 5-2.5-18.5 LF-MCG/0.5 injection; Inject 0.5 mLs into the  muscle once as needed for up to 1 dose for immunization.  Dispense: 0.5 mL; Refill: 0  10. Family history of diabetes mellitus type II Routine labs ordered - CBC with Differential/Platelet - CMP14+EGFR - Lipid Profile - Lipase - Amylase - Hgb A1C w/o eAG - Vitamin D (25 hydroxy) - B12 and Folate Panel     General Counseling: Parris verbalizes understanding of the findings of todays visit and agrees with plan of treatment. I have discussed any further diagnostic evaluation that may be needed or ordered today. We also reviewed his medications today. he has been encouraged to call the office with any questions or concerns that should arise related to todays visit.    Orders Placed This Encounter  Procedures   CBC with Differential/Platelet   CMP14+EGFR   Lipid Profile   Lipase   Amylase   Hgb A1C w/o eAG   Vitamin D (25 hydroxy)   B12 and Folate Panel   Ambulatory referral to Dermatology    Meds ordered this encounter  Medications   Tdap (BOOSTRIX) 5-2.5-18.5 LF-MCG/0.5 injection    Sig: Inject 0.5 mLs into the muscle once as needed for up to 1 dose for immunization.    Dispense:  0.5 mL    Refill:  0    Please give patient tdap booster vaccine    Return in about 1 year (around 09/27/2024) for CPE, Nyelah Emmerich PCP and otherwise as needed. .   Total time spent:30 Minutes Time spent includes review of chart, medications, test results, and follow up plan with the patient.   [place] Controlled Substance Database was reviewed by me.  This patient was seen by Mardy Maxin, FNP-C in collaboration with Dr. Sigrid Bathe as a part of collaborative care agreement.  Osaze Hubbert R. Maxin, MSN, FNP-C Internal medicine

## 2023-09-29 ENCOUNTER — Telehealth: Payer: Self-pay | Admitting: Nurse Practitioner

## 2023-09-29 NOTE — Telephone Encounter (Signed)
 Awaiting 09/28/23 office notes for Orthopedic & Dermatology referral-Toni

## 2023-11-01 ENCOUNTER — Encounter: Payer: Self-pay | Admitting: Nurse Practitioner

## 2023-11-01 ENCOUNTER — Telehealth: Payer: Self-pay | Admitting: Nurse Practitioner

## 2023-11-01 NOTE — Telephone Encounter (Signed)
 Orthopedic referral sent via [facility] to [facility]. Gave telephone # [phone].   Dermatology referral sent via [employer] to [facility]. Gave telephone # [phone].  Lvm notifying patient-Toni

## 2023-11-10 ENCOUNTER — Telehealth: Payer: Self-pay | Admitting: Nurse Practitioner

## 2023-11-10 NOTE — Telephone Encounter (Signed)
 Per Niels reinhold Gaba, referral has been closed due to patient not returning calls -Andree

## 2023-11-17 ENCOUNTER — Telehealth: Payer: Self-pay | Admitting: Nurse Practitioner

## 2023-11-17 NOTE — Telephone Encounter (Signed)
 Dermatology appointment 09/13/2024 with [facility]-Toni

## 2024-03-15 IMAGING — CR DG CHEST 2V
1 series · 2 of 2 positions shown · non-contrast
Comparison: 05/20/2021.

CLINICAL DATA: Central chest pain and pressure for 1 month.

EXAM:
CHEST - 2 VIEW

[Series 1: dg chest 2 view · 0.14mm/px · 2 of 2 slices shown]
[im 1/2]
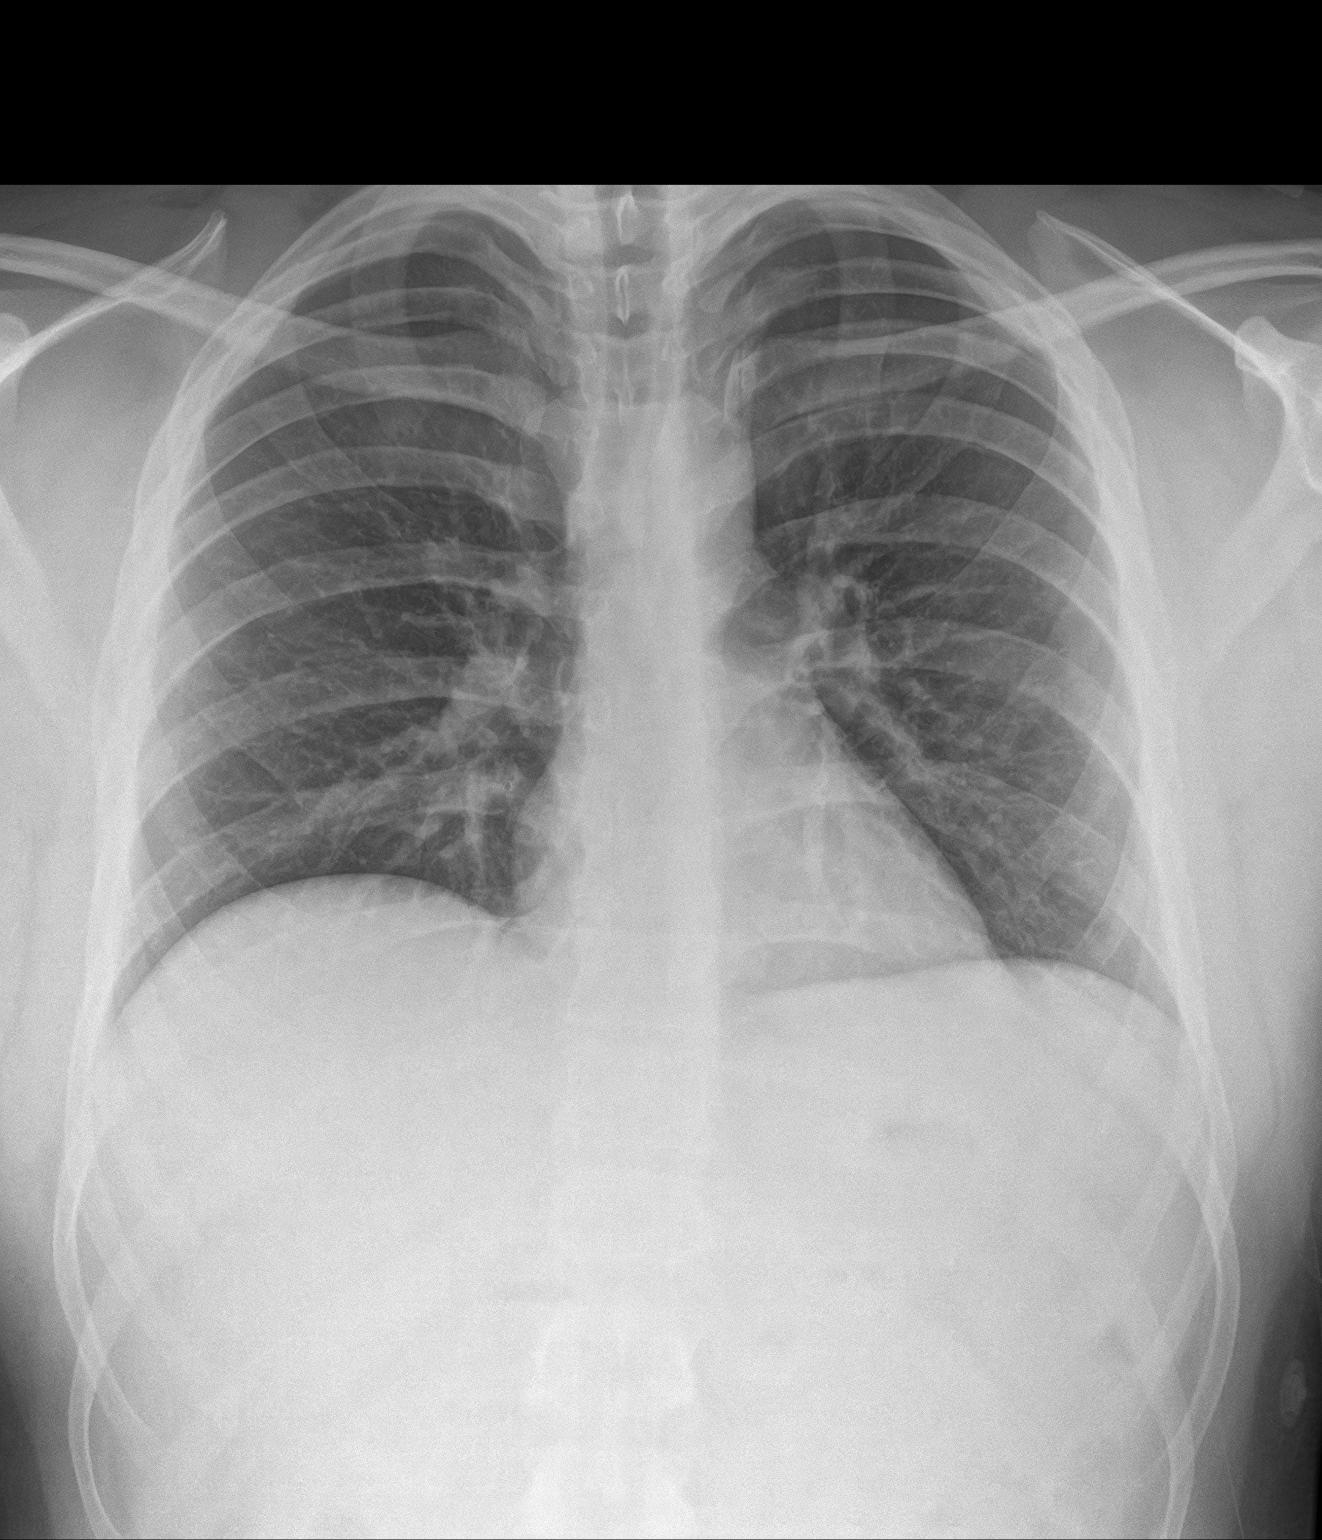
[im 2/2]
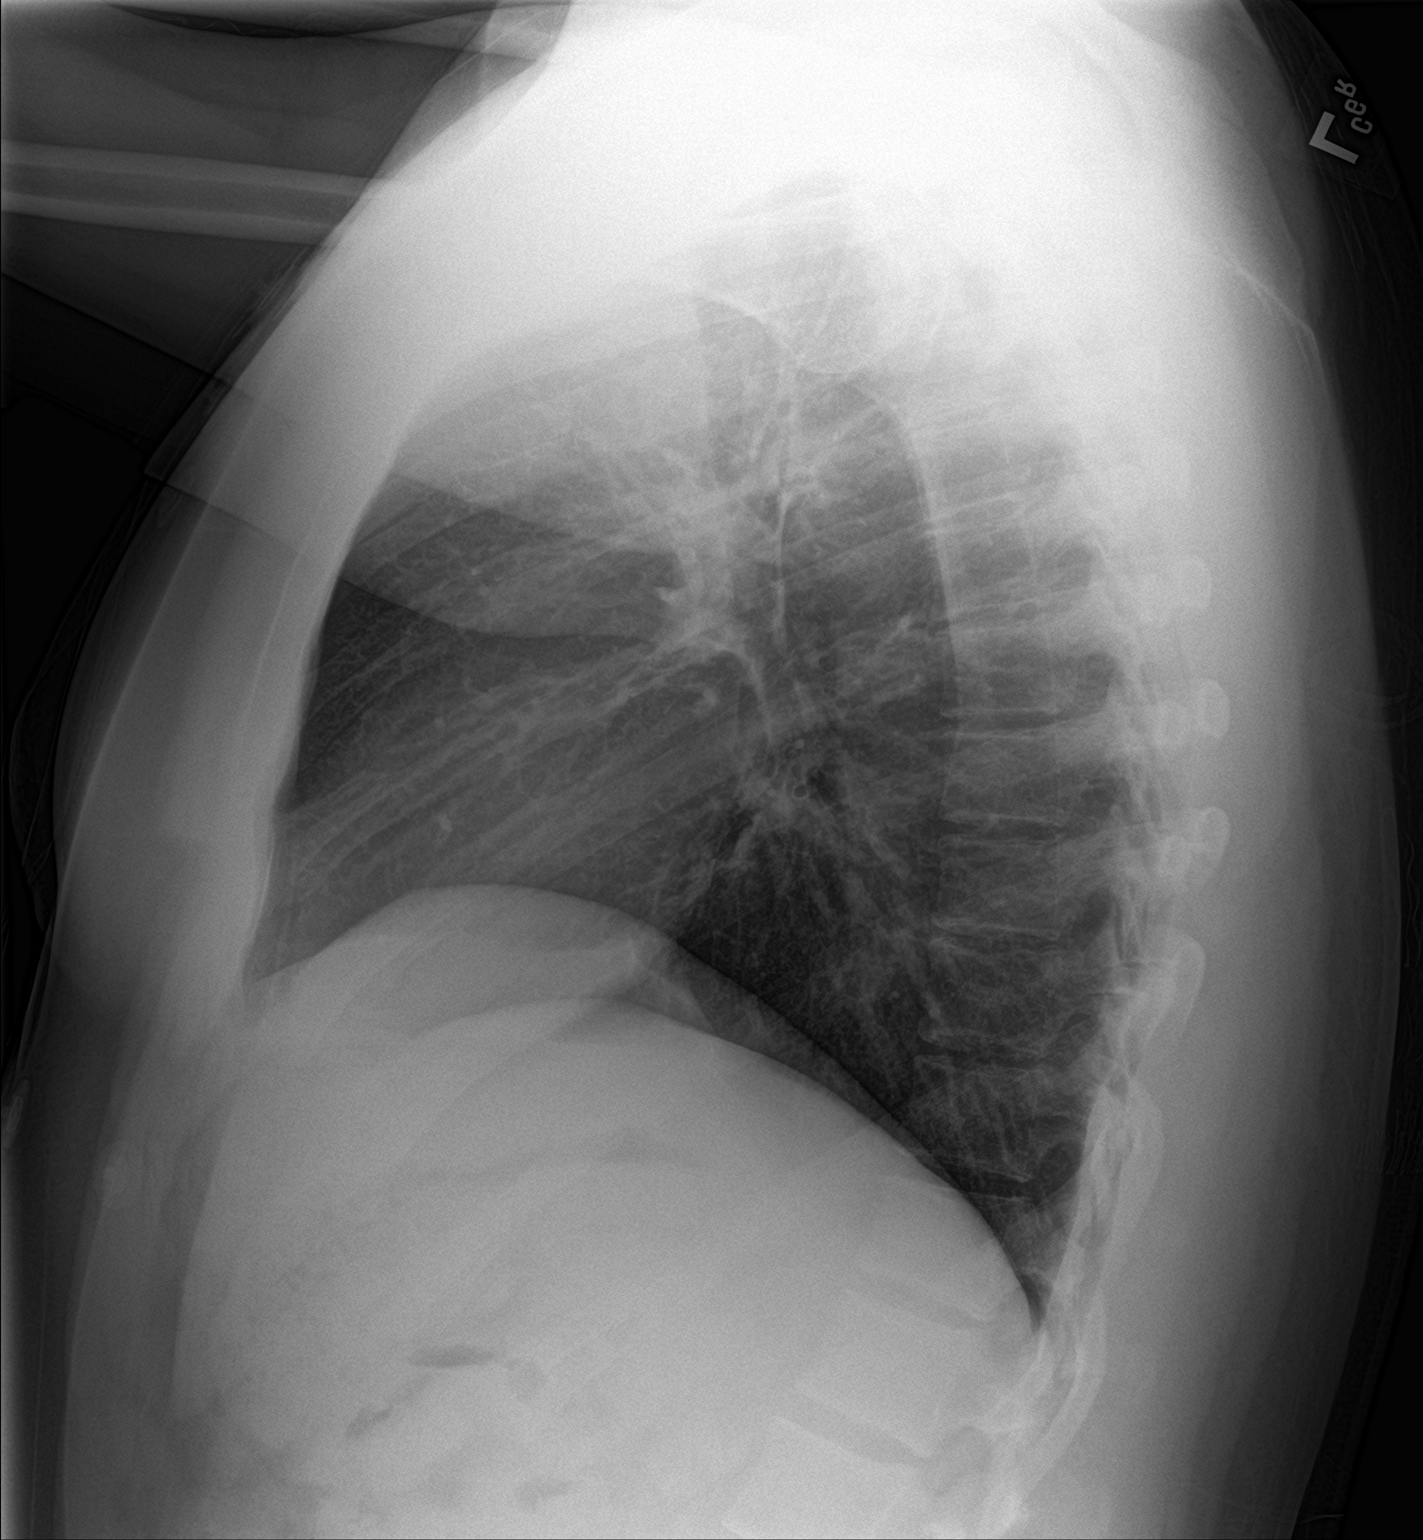

[2 of 2 positions shown; findings below may reference images not displayed]

FINDINGS: Trachea is midline. Heart size normal. Lungs are clear. No pleural
fluid.
IMPRESSION: No acute findings.

## 2024-09-28 ENCOUNTER — Encounter: Admitting: Nurse Practitioner
